# Patient Record
Sex: Male | Born: 1970 | Race: White | Hispanic: No | Marital: Single | Smoking: Never smoker
Health system: Southern US, Community
[De-identification: ages and names within clinical notes are randomized; demographics above are authoritative.]

## PROBLEM LIST (undated history)

## (undated) DIAGNOSIS — F32A Depression, unspecified: Secondary | ICD-10-CM

## (undated) DIAGNOSIS — G35 Multiple sclerosis: Secondary | ICD-10-CM

## (undated) DIAGNOSIS — F329 Major depressive disorder, single episode, unspecified: Secondary | ICD-10-CM

## (undated) DIAGNOSIS — G35D Multiple sclerosis, unspecified: Secondary | ICD-10-CM

## (undated) DIAGNOSIS — G709 Myoneural disorder, unspecified: Secondary | ICD-10-CM

## (undated) HISTORY — DX: Myoneural disorder, unspecified: G70.9

## (undated) HISTORY — DX: Major depressive disorder, single episode, unspecified: F32.9

## (undated) HISTORY — DX: Depression, unspecified: F32.A

## (undated) HISTORY — DX: Multiple sclerosis, unspecified: G35.D

## (undated) HISTORY — DX: Multiple sclerosis: G35

---

## 2002-05-26 ENCOUNTER — Emergency Department (HOSPITAL_COMMUNITY): Admission: EM | Admit: 2002-05-26 | Discharge: 2002-05-26 | Payer: Self-pay | Admitting: *Deleted

## 2007-02-17 ENCOUNTER — Emergency Department (HOSPITAL_COMMUNITY): Admission: EM | Admit: 2007-02-17 | Discharge: 2007-02-18 | Payer: Self-pay | Admitting: Emergency Medicine

## 2009-11-29 ENCOUNTER — Encounter: Admission: RE | Admit: 2009-11-29 | Discharge: 2009-11-29 | Payer: Self-pay | Admitting: Family Medicine

## 2010-03-23 ENCOUNTER — Encounter (HOSPITAL_COMMUNITY): Admission: RE | Admit: 2010-03-23 | Discharge: 2010-03-25 | Payer: Self-pay

## 2010-06-24 ENCOUNTER — Ambulatory Visit: Admission: RE | Admit: 2010-06-24 | Discharge: 2010-06-24 | Payer: Self-pay | Admitting: Family Medicine

## 2011-06-21 LAB — URINALYSIS, ROUTINE W REFLEX MICROSCOPIC
Bilirubin Urine: NEGATIVE
Ketones, ur: NEGATIVE
Nitrite: NEGATIVE
Specific Gravity, Urine: 1.016
Urobilinogen, UA: 0.2
pH: 6

## 2011-09-27 DIAGNOSIS — G35D Multiple sclerosis, unspecified: Secondary | ICD-10-CM | POA: Insufficient documentation

## 2012-03-08 ENCOUNTER — Ambulatory Visit (INDEPENDENT_AMBULATORY_CARE_PROVIDER_SITE_OTHER): Payer: 59 | Admitting: Emergency Medicine

## 2012-03-08 VITALS — BP 110/70 | HR 81 | Temp 98.3°F | Resp 16 | Ht 67.5 in | Wt 151.0 lb

## 2012-03-08 DIAGNOSIS — J329 Chronic sinusitis, unspecified: Secondary | ICD-10-CM

## 2012-03-08 DIAGNOSIS — R599 Enlarged lymph nodes, unspecified: Secondary | ICD-10-CM

## 2012-03-08 DIAGNOSIS — J029 Acute pharyngitis, unspecified: Secondary | ICD-10-CM

## 2012-03-08 DIAGNOSIS — R591 Generalized enlarged lymph nodes: Secondary | ICD-10-CM

## 2012-03-08 DIAGNOSIS — R05 Cough: Secondary | ICD-10-CM

## 2012-03-08 MED ORDER — AMOXICILLIN-POT CLAVULANATE 875-125 MG PO TABS: 1.0000 | ORAL_TABLET | Freq: Two times a day (BID) | ORAL | Status: DC

## 2012-03-08 MED ORDER — FLUTICASONE PROPIONATE 50 MCG/ACT NA SUSP: 2.0000 | Freq: Every day | NASAL | Status: DC

## 2012-03-08 MED ORDER — HYDROCODONE-HOMATROPINE 5-1.5 MG/5ML PO SYRP: 5.0000 mL | ORAL_SOLUTION | Freq: Three times a day (TID) | ORAL | Status: DC | PRN

## 2012-03-08 NOTE — Patient Instructions (Signed)

## 2012-03-08 NOTE — Progress Notes (Signed)
  Subjective:    Patient ID: Colin Brown, male    DOB: [birth year], 41 y.o.   MRN: [id number]  HPI patient with history of MS presents with a few week history of head congestion. He initially thought this was all allergy related but it has become increasingly severe recently. He denies a sore throat and swollen glands in his neck.    Review of Systems     Objective:   Physical Exam  Constitutional: He appears well-developed and well-nourished.  HENT:  Head: Normocephalic.       There is redness of the posterior pharynx with a 1 x 1 cm left anterior cervical node  Cardiovascular: Normal rate and regular rhythm.   Pulmonary/Chest: Effort normal and breath sounds normal. No respiratory distress.    Results for orders placed in visit on 03/08/12  POCT RAPID STREP A (OFFICE)      Component Value Range   Rapid Strep A Screen Negative  Negative        Assessment & Plan:  Patient symptoms probably started with allergies. He has signs of sinusitis with lymphadenitis on the left side of his neck. We'll treat with Flonase and Augmentin have been a strep test because he has 2 children at home.

## 2012-03-08 NOTE — Addendum Note (Signed)
Addended by: Lesle Chris A on: 03/08/2012 06:21 PM   Modules accepted: Orders

## 2012-03-14 ENCOUNTER — Ambulatory Visit (INDEPENDENT_AMBULATORY_CARE_PROVIDER_SITE_OTHER): Payer: 59 | Admitting: Family Medicine

## 2012-03-14 ENCOUNTER — Telehealth: Payer: Self-pay

## 2012-03-14 VITALS — BP 117/81 | HR 87 | Temp 97.9°F | Resp 16 | Ht 67.0 in | Wt 145.0 lb

## 2012-03-14 DIAGNOSIS — J019 Acute sinusitis, unspecified: Secondary | ICD-10-CM

## 2012-03-14 MED ORDER — LEVOFLOXACIN 500 MG PO TABS: 500.0000 mg | ORAL_TABLET | Freq: Every day | ORAL | Status: AC

## 2012-03-14 NOTE — Telephone Encounter (Signed)
Pt taken 7 days antibiotic,not improved,please advise   Best phone [phone]  [facility] w mkt.

## 2012-03-14 NOTE — Progress Notes (Signed)
@[email or web address] @   Patient ID: Colin Brown MRN: [id number], DOB: [birth year], 41 y.o. Date of Encounter: 03/14/2012, 6:01 PM  Primary Physician: Tally Due, MD  Chief Complaint:  Chief Complaint  Patient presents with  . Follow-up    no improvement since last OV  . Fatigue    feels feverish    HPI: 41 y.o. year old male presents with 21 day history of nasal congestion, post nasal drip, sore throat, sinus pressure, and cough. Afebrile. No chills. Nasal congestion thick and green/yellow. Sinus pressure is the worst symptom. Cough is productive secondary to post nasal drip and not associated with time of day. Ears feel full, leading to sensation of muffled hearing. Has tried OTC cold preps without success. No GI complaints. Appetite fair  No recent antibiotics, recent travels, or sick contacts   No leg trauma, sedentary periods, h/o cancer, or tobacco use.  No past medical history on file.   Home Meds: Prior to Admission medications   Medication Sig Start Date End Date Taking? Authorizing Provider  amoxicillin-clavulanate (AUGMENTIN) 875-125 MG per tablet Take 1 tablet by mouth 2 (two) times daily. 03/08/12 03/18/12 Yes Collene Gobble, MD  baclofen (LIORESAL) 20 MG tablet Take 20 mg by mouth 3 (three) times daily.   Yes Historical Provider, MD  escitalopram (LEXAPRO) 20 MG tablet Take 20 mg by mouth daily.   Yes Historical Provider, MD  fluticasone (FLONASE) 50 MCG/ACT nasal spray Place 2 sprays into the nose daily. 03/08/12 03/08/13 Yes Collene Gobble, MD  gabapentin (NEURONTIN) 300 MG capsule Take 300 mg by mouth 3 (three) times daily. 2- 300mg  in the am; 2-3 at night   Yes Historical Provider, MD  glatiramer (COPAXONE) 20 MG/ML injection Inject 20 mg into the skin daily.   Yes Historical Provider, MD  HYDROcodone-homatropine (HYCODAN) 5-1.5 MG/5ML syrup Take 5 mLs by mouth every 8 (eight) hours as needed for cough. 03/08/12 03/18/12 Yes Collene Gobble, MD    Allergies: No Known  Allergies  History   Social History  . Marital Status: Single    Spouse Name: N/A    Number of Children: N/A  . Years of Education: N/A   Occupational History  . Not on file.   Social History Main Topics  . Smoking status: Never Smoker   . Smokeless tobacco: Not on file  . Alcohol Use: Not on file  . Drug Use: Not on file  . Sexually Active: Not on file   Other Topics Concern  . Not on file   Social History Narrative  . No narrative on file     Review of Systems: Constitutional: negative for , fever, night sweats or weight changes Cardiovascular: negative for chest pain or palpitations Respiratory: negative for hemoptysis, wheezing, or shortness of breath Abdominal: negative for abdominal pain, nausea, vomiting or diarrhea Dermatological: negative for rash Neurologic: negative for headache   Physical Exam: Blood pressure 117/81, pulse 87, temperature 97.9 F (36.6 C), temperature source Oral, resp. rate 16, height 5\' 7"  (1.702 m), weight 145 lb (65.772 kg), SpO2 96.00%., Body mass index is 22.71 kg/(m^2). General: Well developed, well nourished, in no acute distress. Head: Normocephalic, atraumatic, eyes without discharge, sclera non-icteric, nares are congested. Bilateral auditory canals clear, TM's are without perforation, pearly grey with reflective cone of light bilaterally. Serous effusion bilaterally behind TM's. Maxillary sinus TTP. Oral cavity moist, dentition normal. Posterior pharynx with post nasal drip and mild erythema. No peritonsillar abscess or tonsillar exudate. Neck: Supple. No thyromegaly.  Full ROM. No lymphadenopathy. Lungs: Clear bilaterally to auscultation without wheezes, rales, or rhonchi. Breathing is unlabored.  Heart: RRR with S1 S2. No murmurs, rubs, or gallops appreciated. Msk:  Strength and tone normal for age. Extremities: No clubbing or cyanosis. No edema. Neuro: Alert and oriented X 3. Moves all extremities spontaneously. CNII-XII grossly  in tact. Psych:  Responds to questions appropriately with a normal affect.     ASSESSMENT AND PLAN:  41 y.o. year old male with sinusitis 1. Sinusitis acute  levofloxacin (LEVAQUIN) 500 MG tablet    - -Mucinex -Tylenol/Motrin prn -Rest/fluids -RTC precautions -RTC 3-5 days if no improvement  Signed, Elvina Sidle, MD 03/14/2012 6:01 PM

## 2012-03-14 NOTE — Telephone Encounter (Signed)
Spoke with patient and he stated that he still has swollen neck.  Advised patient to return to clinic for reevaluation.  He will come in today or tomorrow.

## 2012-03-27 DIAGNOSIS — F419 Anxiety disorder, unspecified: Secondary | ICD-10-CM | POA: Insufficient documentation

## 2012-04-30 DIAGNOSIS — Z8669 Personal history of other diseases of the nervous system and sense organs: Secondary | ICD-10-CM | POA: Insufficient documentation

## 2012-09-10 ENCOUNTER — Ambulatory Visit (INDEPENDENT_AMBULATORY_CARE_PROVIDER_SITE_OTHER): Payer: 59 | Admitting: Emergency Medicine

## 2012-09-10 VITALS — BP 88/64 | HR 99 | Temp 100.4°F | Resp 18 | Wt 148.0 lb

## 2012-09-10 DIAGNOSIS — J029 Acute pharyngitis, unspecified: Secondary | ICD-10-CM

## 2012-09-10 DIAGNOSIS — R509 Fever, unspecified: Secondary | ICD-10-CM

## 2012-09-10 DIAGNOSIS — J111 Influenza due to unidentified influenza virus with other respiratory manifestations: Secondary | ICD-10-CM

## 2012-09-10 LAB — POCT INFLUENZA A/B: Influenza A, POC: NEGATIVE

## 2012-09-10 MED ORDER — HYDROCOD POLST-CHLORPHEN POLST 10-8 MG/5ML PO LQCR: 5.0000 mL | Freq: Two times a day (BID) | ORAL | Status: DC | PRN

## 2012-09-10 MED ORDER — OSELTAMIVIR PHOSPHATE 75 MG PO CAPS: 75.0000 mg | ORAL_CAPSULE | Freq: Two times a day (BID) | ORAL | Status: DC

## 2012-09-10 NOTE — Progress Notes (Signed)
[facility] [address], [place] [address] [phone]  Date:  09/10/2012   Name:  Colin Brown   DOB:  1971/05/29   MRN:  [id number]  PCP:  Tally Due, MD    Chief Complaint: Sore Throat, Headache, Generalized Body Aches, Fever and Nausea   History of Present Illness:  Colin Brown is a 42 y.o. very pleasant male patient who presents with the following:  Sudden onset Sunday of fever, chills, cough and nasal congestion and drainage.  No nausea or vomiting.  Some loose stool.  No wheezing or shortness of breath. Cough productive of purulent sputum.  Clear nasal drainage.  No flu shot. Ill contacts at work.  There is no problem list on file for this patient.   Past Medical History  Diagnosis Date  . Depression   . Neuromuscular disorder   . Multiple sclerosis     History reviewed. No pertinent past surgical history.  History  Substance Use Topics  . Smoking status: Never Smoker   . Smokeless tobacco: Not on file  . Alcohol Use: Yes     Comment: occasional    Family History  Problem Relation Age of Onset  . Hypertension Mother     No Known Allergies  Medication list has been reviewed and updated.  Current Outpatient Prescriptions on File Prior to Visit  Medication Sig Dispense Refill  . amphetamine-dextroamphetamine (ADDERALL) 20 MG tablet Take 20 mg by mouth daily.      . baclofen (LIORESAL) 20 MG tablet Take 10 mg by mouth 3 (three) times daily.       Marland Kitchen escitalopram (LEXAPRO) 20 MG tablet Take 20 mg by mouth daily.      . fluticasone (FLONASE) 50 MCG/ACT nasal spray Place 2 sprays into the nose daily.  16 g  6  . gabapentin (NEURONTIN) 300 MG capsule Take 300 mg by mouth 3 (three) times daily. 2- 300mg  in the am; 2-3 at night      . glatiramer (COPAXONE) 20 MG/ML injection Inject 20 mg into the skin daily.        Review of Systems:  As per HPI, otherwise negative.    Physical Examination: Filed Vitals:   09/10/12 1324    BP: 88/64  Pulse: 99  Temp: 100.4 F (38 C)  Resp: 18   Filed Vitals:   09/10/12 1324  Weight: 148 lb (67.132 kg)   There is no height on file to calculate BMI. Ideal Body Weight:    GEN: WDWN, NAD, Non-toxic, A & O x 3 HEENT: Atraumatic, Normocephalic. Neck supple. No masses, No LAD. Ears and Nose: No external deformity. CV: RRR, No M/G/R. No JVD. No thrill. No extra heart sounds. PULM: CTA B, no wheezes, crackles, rhonchi. No retractions. No resp. distress. No accessory muscle use. ABD: S, NT, ND, +BS. No rebound. No HSM. EXTR: No c/c/e NEURO Normal gait.  PSYCH: Normally interactive. Conversant. Not depressed or anxious appearing.  Calm demeanor.    Assessment and Plan: Cough Flu test Influenza tamiflu IAW CDC guideline with negative POCT tussionex  Carmelina Dane, MD  Results for orders placed in visit on 09/10/12  POCT INFLUENZA A/B      Component Value Range   Influenza A, POC Negative     Influenza B, POC Negative

## 2012-09-10 NOTE — Progress Notes (Signed)
Reviewed and agree.

## 2012-09-10 NOTE — Patient Instructions (Signed)

## 2012-09-16 ENCOUNTER — Ambulatory Visit (INDEPENDENT_AMBULATORY_CARE_PROVIDER_SITE_OTHER): Payer: 59 | Admitting: Family Medicine

## 2012-09-16 VITALS — BP 132/80 | HR 113 | Temp 99.7°F | Resp 17 | Ht 67.0 in | Wt 139.0 lb

## 2012-09-16 DIAGNOSIS — R059 Cough, unspecified: Secondary | ICD-10-CM

## 2012-09-16 DIAGNOSIS — J020 Streptococcal pharyngitis: Secondary | ICD-10-CM

## 2012-09-16 DIAGNOSIS — J029 Acute pharyngitis, unspecified: Secondary | ICD-10-CM

## 2012-09-16 DIAGNOSIS — R05 Cough: Secondary | ICD-10-CM

## 2012-09-16 LAB — POCT CBC
Granulocyte percent: 73.1 %G (ref 37–80)
Lymph, poc: 2.4 (ref 0.6–3.4)
MCHC: 31.1 g/dL — AB (ref 31.8–35.4)
MPV: 7.1 fL (ref 0–99.8)
POC Granulocyte: 9 — AB (ref 2–6.9)
POC LYMPH PERCENT: 19.9 %L (ref 10–50)
POC MID %: 7 %M (ref 0–12)
RDW, POC: 13.7 %

## 2012-09-16 MED ORDER — HYDROCODONE-HOMATROPINE 5-1.5 MG/5ML PO SYRP: 5.0000 mL | ORAL_SOLUTION | ORAL | Status: AC | PRN

## 2012-09-16 MED ORDER — AMOXICILLIN 875 MG PO TABS: 875.0000 mg | ORAL_TABLET | Freq: Two times a day (BID) | ORAL | Status: DC

## 2012-09-16 NOTE — Progress Notes (Signed)
  Subjective: Patient was seen last week. He was feeling better by early in the weekend, then yesterday and today saw worse. He has sore throat and sinus pain and congestion and cough.  Objective: His TMs are normal. Throat erythematous without exudate. Strep screen was done. Neck supple without nodes or thyromegaly. Chest clear. He is a little tender over frontal maxillary sinuses.  Assessment: Streptococcal pharyngitis Sinusitis  Plan: Amoxicillin If his family gets sick suspect strep. [employer] work [employer] account executive.              Results for orders placed in visit on 09/16/12  POCT RAPID STREP A (OFFICE)      Component Value Range   Rapid Strep A Screen Positive (*) Negative  POCT CBC      Component Value Range   WBC 12.3 (*) 4.6 - 10.2 K/uL   Lymph, poc 2.4  0.6 - 3.4   POC LYMPH PERCENT 19.9  10 - 50 %L   MID (cbc) 0.9  0 - 0.9   POC MID % 7.0  0 - 12 %M   POC Granulocyte 9.0 (*) 2 - 6.9   Granulocyte percent 73.1  37 - 80 %G   RBC 4.52 (*) 4.69 - 6.13 M/uL   Hemoglobin 13.2 (*) 14.1 - 18.1 g/dL   HCT, POC 21.3 (*) 08.6 - 53.7 %   MCV 93.9  80 - 97 fL   MCH, POC 29.2  27 - 31.2 pg   MCHC 31.1 (*) 31.8 - 35.4 g/dL   RDW, POC 57.8     Platelet Count, POC 446 (*) 142 - 424 K/uL   MPV 7.1  0 - 99.8 fL

## 2012-09-16 NOTE — Patient Instructions (Signed)

## 2017-06-30 ENCOUNTER — Emergency Department (HOSPITAL_COMMUNITY): Payer: Medicare Other

## 2017-06-30 ENCOUNTER — Emergency Department (HOSPITAL_COMMUNITY)
Admission: EM | Admit: 2017-06-30 | Discharge: 2017-07-01 | Disposition: A | Discharge status: Home / Self Care | Payer: Medicare Other | Attending: Emergency Medicine | Admitting: Emergency Medicine

## 2017-06-30 ENCOUNTER — Encounter (HOSPITAL_COMMUNITY): Payer: Self-pay | Admitting: Emergency Medicine

## 2017-06-30 DIAGNOSIS — Z23 Encounter for immunization: Secondary | ICD-10-CM | POA: Insufficient documentation

## 2017-06-30 DIAGNOSIS — Y929 Unspecified place or not applicable: Secondary | ICD-10-CM | POA: Diagnosis not present

## 2017-06-30 DIAGNOSIS — S0993XA Unspecified injury of face, initial encounter: Secondary | ICD-10-CM

## 2017-06-30 DIAGNOSIS — W19XXXA Unspecified fall, initial encounter: Secondary | ICD-10-CM | POA: Diagnosis not present

## 2017-06-30 DIAGNOSIS — S01511A Laceration without foreign body of lip, initial encounter: Secondary | ICD-10-CM | POA: Diagnosis not present

## 2017-06-30 DIAGNOSIS — Y9389 Activity, other specified: Secondary | ICD-10-CM | POA: Diagnosis not present

## 2017-06-30 DIAGNOSIS — Z79899 Other long term (current) drug therapy: Secondary | ICD-10-CM | POA: Diagnosis not present

## 2017-06-30 DIAGNOSIS — Y998 Other external cause status: Secondary | ICD-10-CM | POA: Diagnosis not present

## 2017-06-30 DIAGNOSIS — R55 Syncope and collapse: Secondary | ICD-10-CM | POA: Diagnosis not present

## 2017-06-30 DIAGNOSIS — S022XXA Fracture of nasal bones, initial encounter for closed fracture: Secondary | ICD-10-CM | POA: Diagnosis not present

## 2017-06-30 LAB — CBC WITH DIFFERENTIAL/PLATELET
BASOS ABS: 0 10*3/uL (ref 0.0–0.1)
BASOS PCT: 0 %
Eosinophils Absolute: 0.1 10*3/uL (ref 0.0–0.7)
Eosinophils Relative: 1 %
HEMATOCRIT: 39.7 % (ref 39.0–52.0)
Hemoglobin: 14 g/dL (ref 13.0–17.0)
LYMPHS ABS: 2.2 10*3/uL (ref 0.7–4.0)
LYMPHS PCT: 21 %
MCH: 31.4 pg (ref 26.0–34.0)
MCHC: 35.3 g/dL (ref 30.0–36.0)
MCV: 89 fL (ref 78.0–100.0)
MONO ABS: 0.7 10*3/uL (ref 0.1–1.0)
Monocytes Relative: 6 %
NEUTROS PCT: 72 %
Neutro Abs: 7.5 10*3/uL (ref 1.7–7.7)
Platelets: 302 10*3/uL (ref 150–400)
RBC: 4.46 MIL/uL (ref 4.22–5.81)
RDW: 12.9 % (ref 11.5–15.5)
WBC: 10.4 10*3/uL (ref 4.0–10.5)

## 2017-06-30 LAB — BASIC METABOLIC PANEL
ANION GAP: 10 (ref 5–15)
BUN: 16 mg/dL (ref 6–20)
CALCIUM: 8.8 mg/dL — AB (ref 8.9–10.3)
CO2: 25 mmol/L (ref 22–32)
Chloride: 104 mmol/L (ref 101–111)
Creatinine, Ser: 1.21 mg/dL (ref 0.61–1.24)
Glucose, Bld: 126 mg/dL — ABNORMAL HIGH (ref 65–99)
Potassium: 3.5 mmol/L (ref 3.5–5.1)
SODIUM: 139 mmol/L (ref 135–145)

## 2017-06-30 LAB — I-STAT TROPONIN, ED: TROPONIN I, POC: 0 ng/mL (ref 0.00–0.08)

## 2017-06-30 LAB — CBG MONITORING, ED: GLUCOSE-CAPILLARY: 93 mg/dL (ref 65–99)

## 2017-06-30 MED ORDER — SODIUM CHLORIDE 0.9 % IV BOLUS (SEPSIS)
1000.0000 mL | Freq: Once | INTRAVENOUS | Status: AC
  Administered 2017-06-30: 1000 mL via INTRAVENOUS

## 2017-06-30 MED ORDER — LIDOCAINE HCL (PF) 1 % IJ SOLN
5.0000 mL | Freq: Once | INTRAMUSCULAR | Status: AC
  Administered 2017-07-01: 5 mL
  Filled 2017-06-30: qty 5

## 2017-06-30 MED ORDER — HYDROCODONE-ACETAMINOPHEN 5-325 MG PO TABS
2.0000 | ORAL_TABLET | Freq: Once | ORAL | Status: AC
  Administered 2017-06-30: 2 via ORAL
  Filled 2017-06-30: qty 2

## 2017-06-30 MED ORDER — TETANUS-DIPHTH-ACELL PERTUSSIS 5-2.5-18.5 LF-MCG/0.5 IM SUSP
0.5000 mL | Freq: Once | INTRAMUSCULAR | Status: AC
  Administered 2017-06-30: 0.5 mL via INTRAMUSCULAR
  Filled 2017-06-30: qty 0.5
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
## 2017-06-30 NOTE — ED Notes (Signed)
Bed: WA24 Expected date:  Expected time:  Means of arrival:  Comments: syncope 

## 2017-06-30 NOTE — Discharge Instructions (Signed)
Your fracture information is listed below.  Please discuss this with your Dentist tomorrow.  Acute avulsion fracture tooth 8 through alveolar ridge. Nondisplaced fracture through nasal spine.  Sutures should dissolve in 5-7 days.  Use salt water rinses.  Take antibiotics as directed.
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
## 2017-07-01 MED ORDER — HYDROCODONE-ACETAMINOPHEN 5-325 MG PO TABS: 1.0000 | ORAL_TABLET | Freq: Four times a day (QID) | ORAL | 0 refills | Status: DC | PRN

## 2017-07-01 MED ORDER — CLINDAMYCIN HCL 150 MG PO CAPS: 300.0000 mg | ORAL_CAPSULE | Freq: Three times a day (TID) | ORAL | 0 refills | Status: DC

## 2017-09-20 DIAGNOSIS — F339 Major depressive disorder, recurrent, unspecified: Secondary | ICD-10-CM | POA: Insufficient documentation

## 2019-06-01 IMAGING — CT CT MAXILLOFACIAL W/O CM
3 of 7 series · 16 of 47 positions shown, 19 images · non-contrast
Comparison: None.

CLINICAL DATA: Syncopal episode, fell on face after drinking beers.
Facial pain. History of multiple sclerosis.

EXAM:
CT HEAD WITHOUT CONTRAST
CT MAXILLOFACIAL WITHOUT CONTRAST
TECHNIQUE: Multidetector CT imaging of the head and maxillofacial structures
were performed using the standard protocol without intravenous
contrast. Multiplanar CT image reconstructions of the maxillofacial
structures were also generated.

[Series 5: coronal · coronal · 0.29mm/px · 3 of 74 slices shown]
[im 17/74  bone]
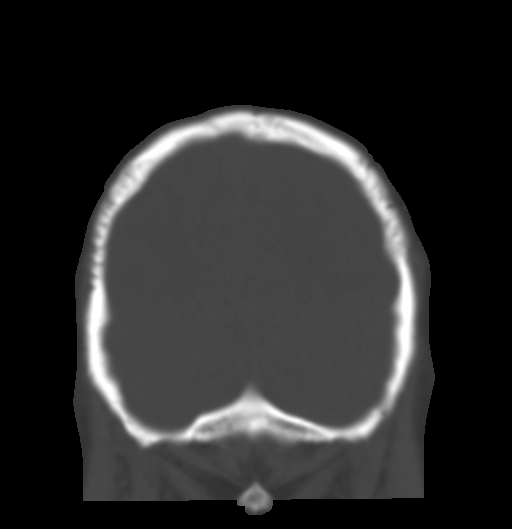
[im 25/74  bone]
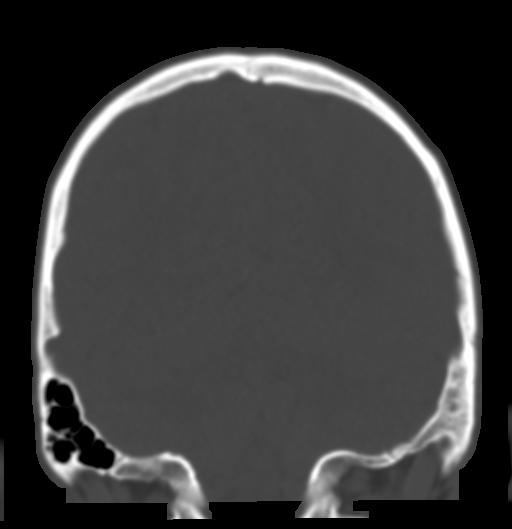
[im 33/74  bone]
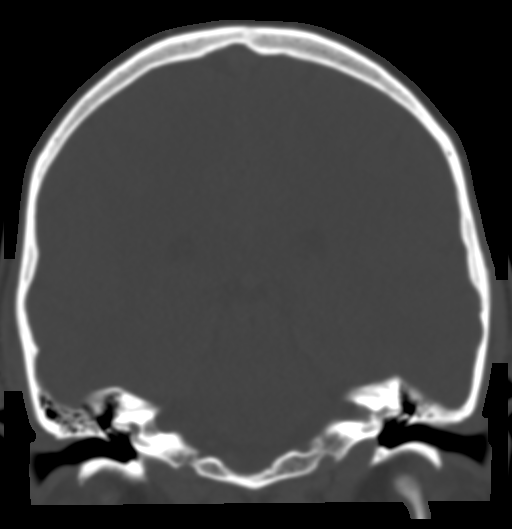

[Series 7: facial st · axial · 0.32mm/px · z∈[-221,-83]mm · 11 of 81 slices shown, 14 images]
[im 6/81  brain]
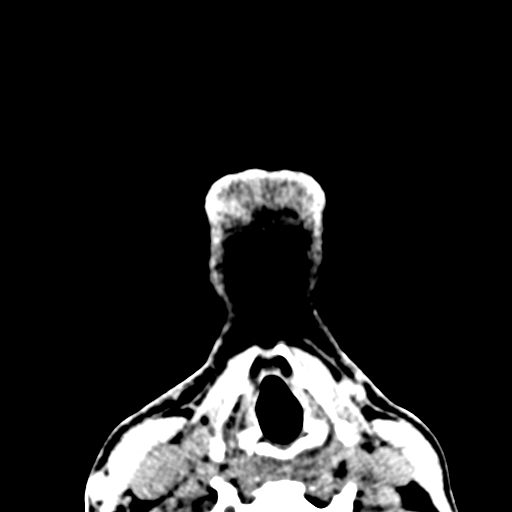
[im 6/81  bone]
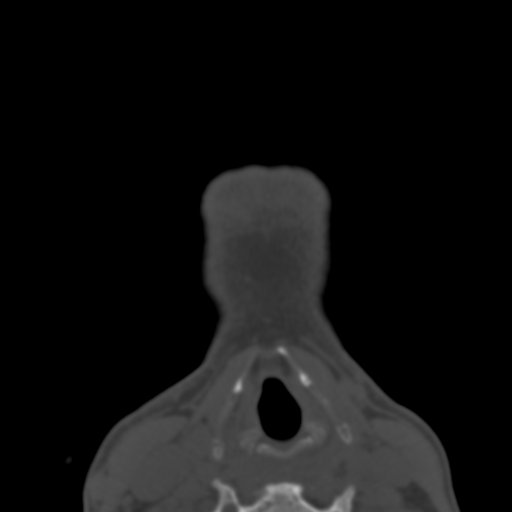
[im 12/81  bone]
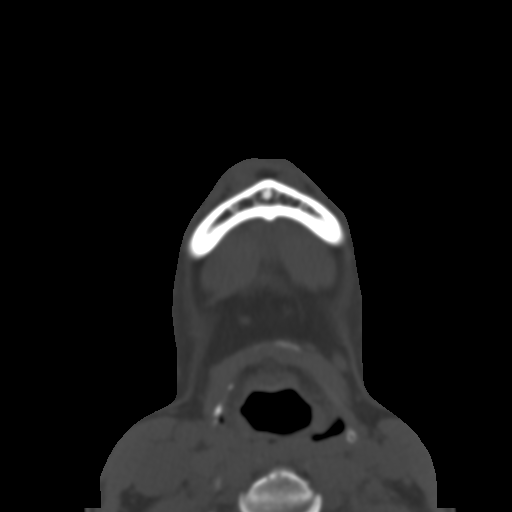
[im 18/81  bone]
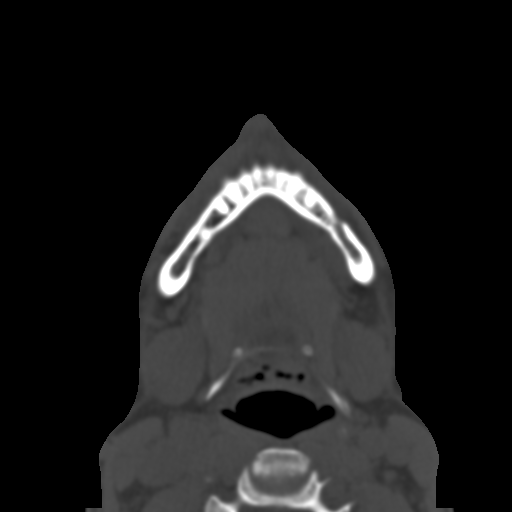
[im 29/81  bone]
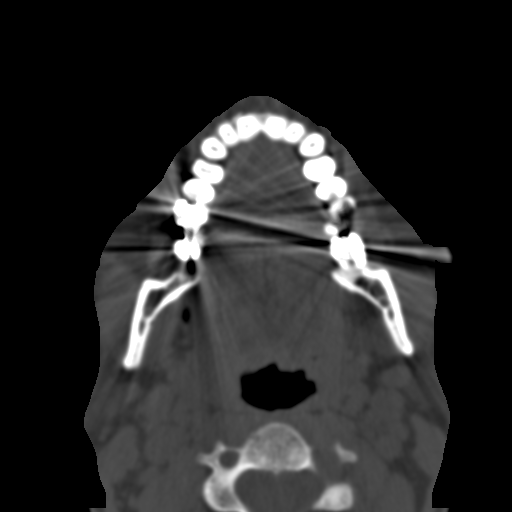
[im 35/81  brain]
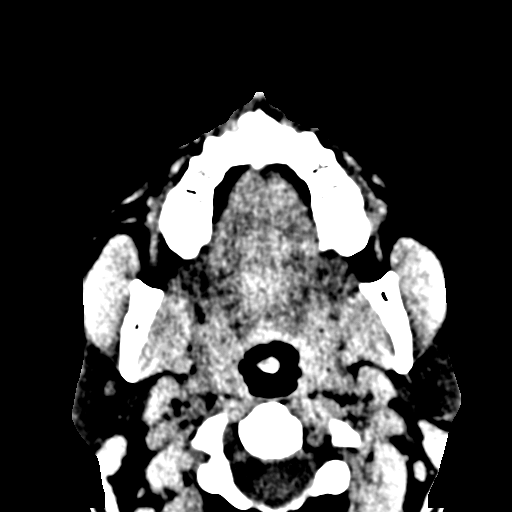
[im 35/81  bone]
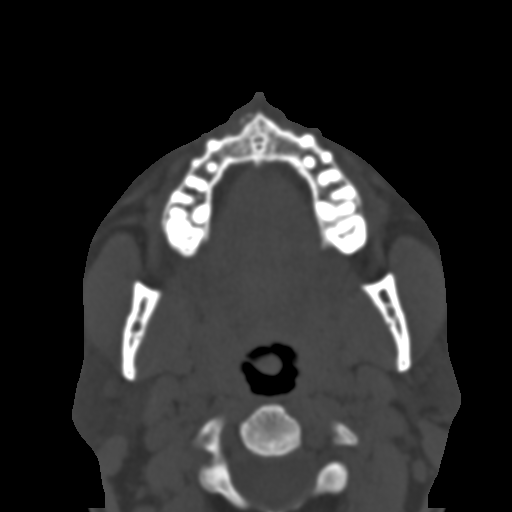
[im 41/81  bone]
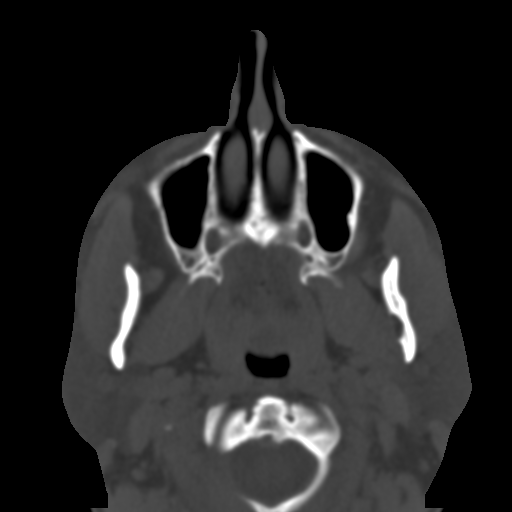
[im 46/81  bone]
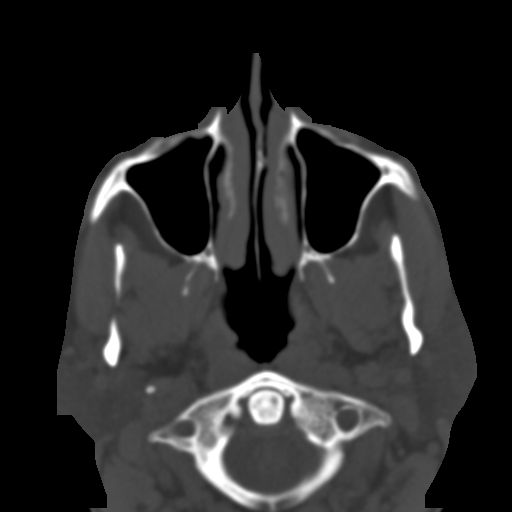
[im 52/81  bone]
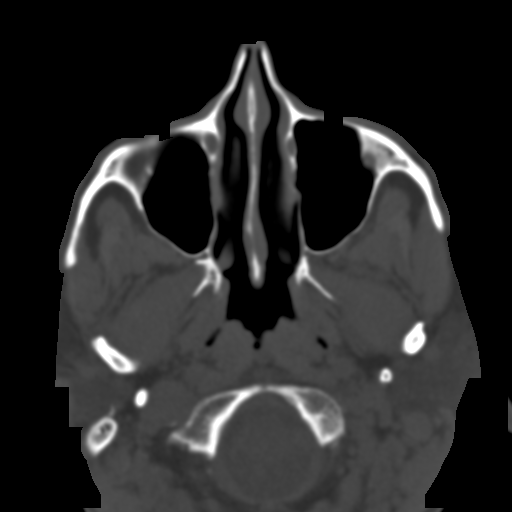
[im 63/81  brain]
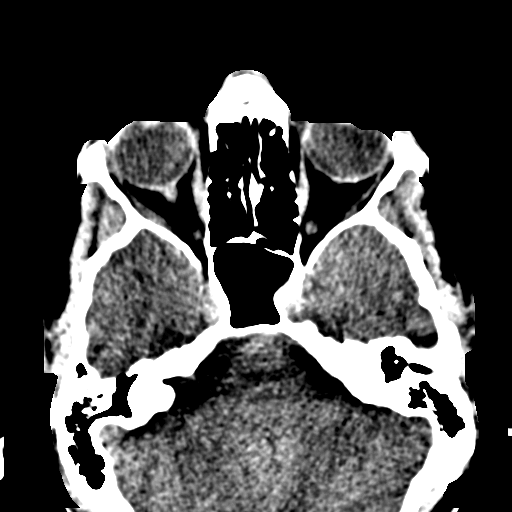
[im 63/81  bone]
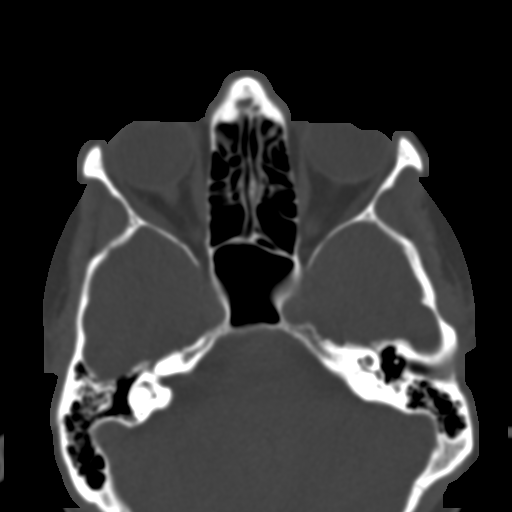
[im 69/81  bone]
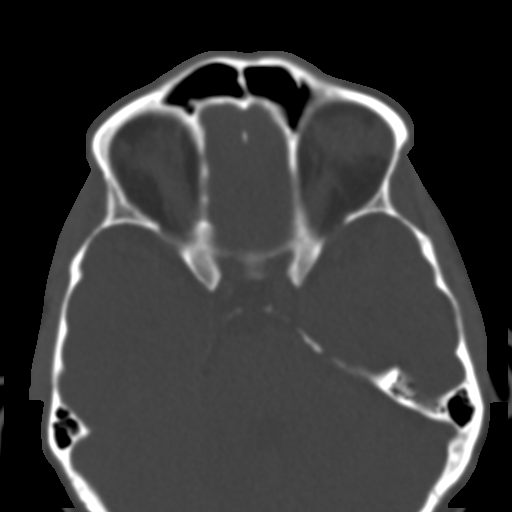
[im 75/81  bone]
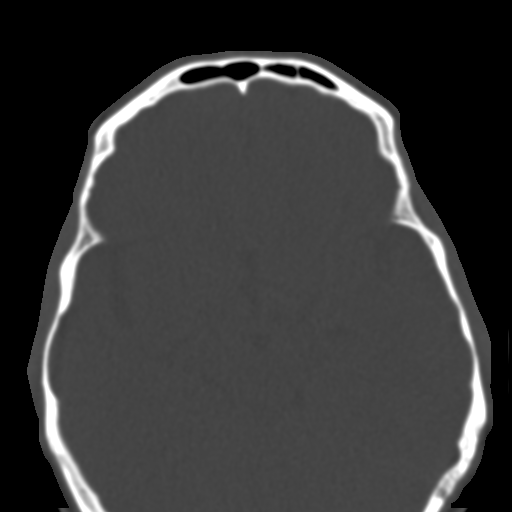

[Series 10: sagittal st · sagittal · 0.32mm/px · 2 of 73 slices shown]
[im 25/73  bone]
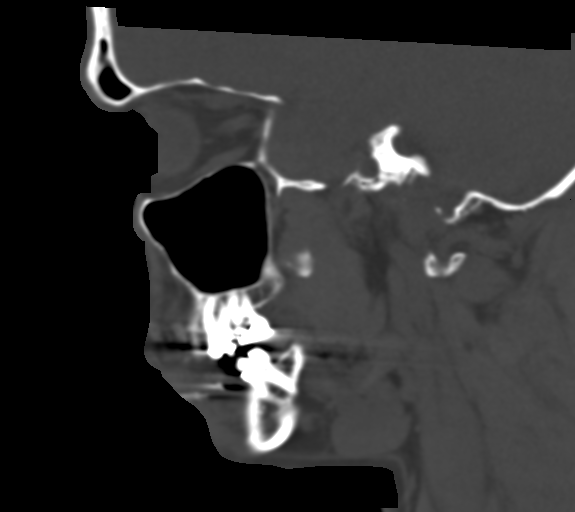
[im 49/73  bone]
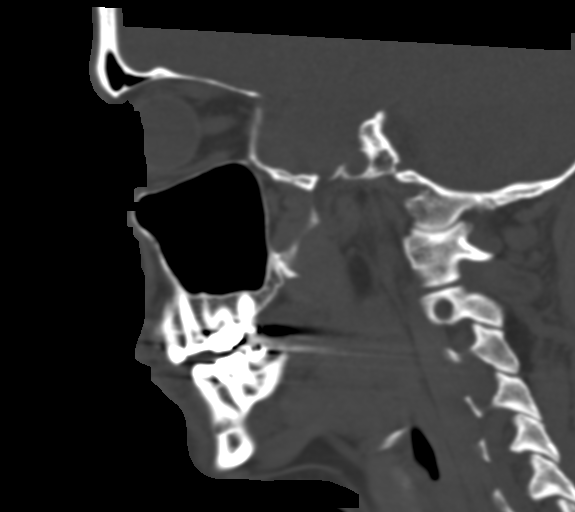

[16 of 47 positions shown; findings below may reference images not displayed]

FINDINGS: CT HEAD FINDINGS

BRAIN: No intraparenchymal hemorrhage, mass effect nor midline
shift. The ventricles and sulci are normal. No acute large vascular
territory infarcts. No abnormal extra-axial fluid collections. Basal
cisterns are patent.

VASCULAR: Unremarkable.

SKULL/SOFT TISSUES: No skull fracture. No significant soft tissue
swelling.

OTHER: None.

CT MAXILLOFACIAL FINDINGS

OSSEOUS: Acute avulsion fracture tooth 8 through alveolar ridge.
Nondisplaced fracture through nasal spine. Mandible is intact,
condyles are located. No destructive bony lesions.

ORBITS: Ocular globes and orbital contents are normal.

SINUSES: Paranasal sinuses are well aerated. Nasal septum is
midline, tiny spur directed to the LEFT. Included mastoid aircells
are well aerated.

SOFT TISSUES: Perioral soft tissue swelling without subcutaneous gas
or radiopaque foreign bodies. No subcutaneous gas or radiopaque
foreign bodies.
IMPRESSION: CT HEAD: Negative noncontrast CT HEAD.

CT MAXILLOFACIAL: Acute avulsion fracture tooth 8. Nondisplaced
nasal spine fracture.

## 2019-10-21 ENCOUNTER — Ambulatory Visit (INDEPENDENT_AMBULATORY_CARE_PROVIDER_SITE_OTHER): Payer: Medicare Other | Admitting: Psychology

## 2019-10-21 DIAGNOSIS — F321 Major depressive disorder, single episode, moderate: Secondary | ICD-10-CM

## 2019-11-04 ENCOUNTER — Ambulatory Visit (INDEPENDENT_AMBULATORY_CARE_PROVIDER_SITE_OTHER): Payer: Medicare Other | Admitting: Psychology

## 2019-11-04 DIAGNOSIS — F321 Major depressive disorder, single episode, moderate: Secondary | ICD-10-CM

## 2019-11-12 ENCOUNTER — Ambulatory Visit (INDEPENDENT_AMBULATORY_CARE_PROVIDER_SITE_OTHER): Payer: Medicare Other | Admitting: Psychology

## 2019-11-12 DIAGNOSIS — F321 Major depressive disorder, single episode, moderate: Secondary | ICD-10-CM

## 2019-11-18 ENCOUNTER — Ambulatory Visit: Payer: Medicare Other | Admitting: Psychology

## 2019-11-21 ENCOUNTER — Ambulatory Visit (INDEPENDENT_AMBULATORY_CARE_PROVIDER_SITE_OTHER): Payer: Medicare Other | Admitting: Psychology

## 2019-11-21 DIAGNOSIS — F321 Major depressive disorder, single episode, moderate: Secondary | ICD-10-CM | POA: Diagnosis not present

## 2019-12-04 ENCOUNTER — Ambulatory Visit (INDEPENDENT_AMBULATORY_CARE_PROVIDER_SITE_OTHER): Payer: Medicare Other | Admitting: Psychology

## 2019-12-04 DIAGNOSIS — F411 Generalized anxiety disorder: Secondary | ICD-10-CM

## 2019-12-04 DIAGNOSIS — F339 Major depressive disorder, recurrent, unspecified: Secondary | ICD-10-CM

## 2019-12-18 ENCOUNTER — Ambulatory Visit (INDEPENDENT_AMBULATORY_CARE_PROVIDER_SITE_OTHER): Payer: Medicare Other | Admitting: Psychology

## 2019-12-18 DIAGNOSIS — F321 Major depressive disorder, single episode, moderate: Secondary | ICD-10-CM

## 2019-12-30 DIAGNOSIS — J302 Other seasonal allergic rhinitis: Secondary | ICD-10-CM | POA: Insufficient documentation

## 2020-01-01 ENCOUNTER — Ambulatory Visit (INDEPENDENT_AMBULATORY_CARE_PROVIDER_SITE_OTHER): Payer: Medicare Other | Admitting: Psychology

## 2020-01-01 DIAGNOSIS — F321 Major depressive disorder, single episode, moderate: Secondary | ICD-10-CM | POA: Diagnosis not present

## 2020-01-15 ENCOUNTER — Ambulatory Visit (INDEPENDENT_AMBULATORY_CARE_PROVIDER_SITE_OTHER): Payer: Medicare Other | Admitting: Psychology

## 2020-01-15 DIAGNOSIS — F321 Major depressive disorder, single episode, moderate: Secondary | ICD-10-CM | POA: Diagnosis not present

## 2020-01-29 ENCOUNTER — Ambulatory Visit (INDEPENDENT_AMBULATORY_CARE_PROVIDER_SITE_OTHER): Payer: Medicare Other | Admitting: Psychology

## 2020-01-29 DIAGNOSIS — F321 Major depressive disorder, single episode, moderate: Secondary | ICD-10-CM | POA: Diagnosis not present

## 2020-02-12 ENCOUNTER — Ambulatory Visit: Payer: Medicare Other | Admitting: Psychology

## 2020-02-26 ENCOUNTER — Ambulatory Visit (INDEPENDENT_AMBULATORY_CARE_PROVIDER_SITE_OTHER): Payer: Medicare Other | Admitting: Psychology

## 2020-02-26 DIAGNOSIS — F321 Major depressive disorder, single episode, moderate: Secondary | ICD-10-CM | POA: Diagnosis not present

## 2020-03-11 ENCOUNTER — Ambulatory Visit (INDEPENDENT_AMBULATORY_CARE_PROVIDER_SITE_OTHER): Payer: Medicare Other | Admitting: Psychology

## 2020-03-11 DIAGNOSIS — F321 Major depressive disorder, single episode, moderate: Secondary | ICD-10-CM

## 2020-03-25 ENCOUNTER — Ambulatory Visit (INDEPENDENT_AMBULATORY_CARE_PROVIDER_SITE_OTHER): Payer: Medicare Other | Admitting: Psychology

## 2020-03-25 DIAGNOSIS — F321 Major depressive disorder, single episode, moderate: Secondary | ICD-10-CM | POA: Diagnosis not present

## 2020-04-08 ENCOUNTER — Ambulatory Visit: Payer: Medicare Other | Admitting: Psychology

## 2020-04-22 ENCOUNTER — Ambulatory Visit: Payer: Medicare Other | Admitting: Psychology

## 2020-05-06 ENCOUNTER — Ambulatory Visit: Payer: Medicare Other | Admitting: Psychology

## 2020-05-20 ENCOUNTER — Ambulatory Visit (INDEPENDENT_AMBULATORY_CARE_PROVIDER_SITE_OTHER): Payer: Medicare Other | Admitting: Psychology

## 2020-05-20 DIAGNOSIS — F321 Major depressive disorder, single episode, moderate: Secondary | ICD-10-CM | POA: Diagnosis not present

## 2020-06-03 ENCOUNTER — Ambulatory Visit: Payer: Medicare Other | Admitting: Psychology

## 2020-06-17 ENCOUNTER — Ambulatory Visit (INDEPENDENT_AMBULATORY_CARE_PROVIDER_SITE_OTHER): Payer: Medicare Other | Admitting: Psychology

## 2020-06-17 DIAGNOSIS — F321 Major depressive disorder, single episode, moderate: Secondary | ICD-10-CM | POA: Diagnosis not present

## 2020-07-15 ENCOUNTER — Ambulatory Visit: Payer: Medicare Other | Admitting: Psychology

## 2020-07-28 ENCOUNTER — Ambulatory Visit (INDEPENDENT_AMBULATORY_CARE_PROVIDER_SITE_OTHER): Payer: Medicare Other | Admitting: Psychology

## 2020-07-28 DIAGNOSIS — F321 Major depressive disorder, single episode, moderate: Secondary | ICD-10-CM

## 2020-08-25 ENCOUNTER — Ambulatory Visit (INDEPENDENT_AMBULATORY_CARE_PROVIDER_SITE_OTHER): Payer: Medicare Other | Admitting: Psychology

## 2020-08-25 DIAGNOSIS — F321 Major depressive disorder, single episode, moderate: Secondary | ICD-10-CM | POA: Diagnosis not present

## 2020-09-09 ENCOUNTER — Ambulatory Visit: Payer: Medicare Other | Admitting: Psychology

## 2020-09-23 ENCOUNTER — Ambulatory Visit: Payer: Medicare Other | Admitting: Psychology

## 2020-10-21 ENCOUNTER — Ambulatory Visit: Payer: Medicare Other | Admitting: Psychology

## 2021-07-05 DIAGNOSIS — I10 Essential (primary) hypertension: Secondary | ICD-10-CM | POA: Insufficient documentation

## 2021-07-05 DIAGNOSIS — R5383 Other fatigue: Secondary | ICD-10-CM | POA: Insufficient documentation

## 2021-07-05 DIAGNOSIS — E785 Hyperlipidemia, unspecified: Secondary | ICD-10-CM | POA: Insufficient documentation

## 2022-12-25 DIAGNOSIS — E559 Vitamin D deficiency, unspecified: Secondary | ICD-10-CM | POA: Diagnosis present

## 2024-09-06 ENCOUNTER — Inpatient Hospital Stay (HOSPITAL_COMMUNITY)
Admission: EM | Admit: 2024-09-06 | Discharge: 2024-09-08 | DRG: 059 | Disposition: A | Discharge status: Home / Self Care | Attending: Internal Medicine | Admitting: Internal Medicine

## 2024-09-06 ENCOUNTER — Other Ambulatory Visit: Payer: Self-pay

## 2024-09-06 ENCOUNTER — Encounter (HOSPITAL_COMMUNITY): Payer: Self-pay

## 2024-09-06 DIAGNOSIS — H469 Unspecified optic neuritis: Secondary | ICD-10-CM | POA: Diagnosis present

## 2024-09-06 DIAGNOSIS — F32A Depression, unspecified: Secondary | ICD-10-CM | POA: Diagnosis present

## 2024-09-06 DIAGNOSIS — R519 Headache, unspecified: Secondary | ICD-10-CM | POA: Diagnosis present

## 2024-09-06 DIAGNOSIS — Z885 Allergy status to narcotic agent status: Secondary | ICD-10-CM

## 2024-09-06 DIAGNOSIS — G35D Multiple sclerosis, unspecified: Principal | ICD-10-CM | POA: Diagnosis present

## 2024-09-06 DIAGNOSIS — E559 Vitamin D deficiency, unspecified: Secondary | ICD-10-CM | POA: Diagnosis present

## 2024-09-06 DIAGNOSIS — Z79899 Other long term (current) drug therapy: Secondary | ICD-10-CM

## 2024-09-06 DIAGNOSIS — I10 Essential (primary) hypertension: Secondary | ICD-10-CM | POA: Diagnosis present

## 2024-09-06 DIAGNOSIS — E785 Hyperlipidemia, unspecified: Secondary | ICD-10-CM | POA: Diagnosis present

## 2024-09-06 DIAGNOSIS — H4921 Sixth [abducent] nerve palsy, right eye: Secondary | ICD-10-CM | POA: Diagnosis present

## 2024-09-06 DIAGNOSIS — Z8249 Family history of ischemic heart disease and other diseases of the circulatory system: Secondary | ICD-10-CM

## 2024-09-06 DIAGNOSIS — H5461 Unqualified visual loss, right eye, normal vision left eye: Secondary | ICD-10-CM | POA: Diagnosis present

## 2024-09-06 DIAGNOSIS — G629 Polyneuropathy, unspecified: Secondary | ICD-10-CM | POA: Diagnosis present

## 2024-09-06 LAB — BASIC METABOLIC PANEL WITH GFR
Anion gap: 10 (ref 5–15)
BUN: 17 mg/dL (ref 6–20)
CO2: 27 mmol/L (ref 22–32)
Calcium: 9.6 mg/dL (ref 8.9–10.3)
Chloride: 102 mmol/L (ref 98–111)
Creatinine, Ser: 1.2 mg/dL (ref 0.61–1.24)
GFR, Estimated: >60 mL/min
Glucose, Bld: 84 mg/dL (ref 70–99)
Potassium: 3.8 mmol/L (ref 3.5–5.1)
Sodium: 138 mmol/L (ref 135–145)

## 2024-09-06 LAB — CBC WITH DIFFERENTIAL/PLATELET
Abs Immature Granulocytes: 0.04 K/uL (ref 0.00–0.07)
Basophils Absolute: 0 K/uL (ref 0.0–0.1)
Basophils Relative: 1 %
Eosinophils Absolute: 0.4 K/uL (ref 0.0–0.5)
Eosinophils Relative: 4 %
HCT: 45.8 % (ref 39.0–52.0)
Hemoglobin: 15.7 g/dL (ref 13.0–17.0)
Immature Granulocytes: 1 %
Lymphocytes Relative: 31 %
Lymphs Abs: 2.7 K/uL (ref 0.7–4.0)
MCH: 30.7 pg (ref 26.0–34.0)
MCHC: 34.3 g/dL (ref 30.0–36.0)
MCV: 89.5 fL (ref 80.0–100.0)
Monocytes Absolute: 0.6 K/uL (ref 0.1–1.0)
Monocytes Relative: 7 %
Neutro Abs: 5 K/uL (ref 1.7–7.7)
Neutrophils Relative %: 56 %
Platelets: 326 K/uL (ref 150–400)
RBC: 5.12 MIL/uL (ref 4.22–5.81)
RDW: 12.5 % (ref 11.5–15.5)
WBC: 8.8 K/uL (ref 4.0–10.5)
nRBC: 0 % (ref 0.0–0.2)

## 2024-09-06 MED ORDER — LORAZEPAM 2 MG/ML IJ SOLN
1.0000 mg | Freq: Once | INTRAMUSCULAR | Status: AC
  Administered 2024-09-07: 1 mg via INTRAVENOUS
  Filled 2024-09-06: qty 1

## 2024-09-06 NOTE — ED Triage Notes (Signed)
 Pt reports hx MS, states he thinks he is having optic neuritis in R eye, endorses diplopia x 1 week; hx same; pt also reports HA

## 2024-09-06 NOTE — ED Provider Triage Note (Signed)
 Emergency Medicine Provider Triage Evaluation Note  Colin Brown , a 54 y.o. male  was evaluated in triage.  Pt complains of double vision in the right eye that started about 1 week ago.  Reports pain behind the right eye.  Reports this feels somewhat similar to previous episode of optic neuritis.  Reports history of MS.  Also reports difficulty with movement in the right eye.  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  Pupils are equal round reactive bilaterally  Medical Decision Making  Medically screening exam initiated at 7:42 PM.  Appropriate orders placed.  Colin Brown was informed that the remainder of the evaluation will be completed by another provider, this initial triage assessment does not replace that evaluation, and the importance of remaining in the ED until their evaluation is complete.     Colin Palma, PA-C 09/06/24 1942

## 2024-09-07 ENCOUNTER — Emergency Department (HOSPITAL_COMMUNITY)

## 2024-09-07 DIAGNOSIS — Z8249 Family history of ischemic heart disease and other diseases of the circulatory system: Secondary | ICD-10-CM | POA: Diagnosis not present

## 2024-09-07 DIAGNOSIS — E559 Vitamin D deficiency, unspecified: Secondary | ICD-10-CM | POA: Diagnosis present

## 2024-09-07 DIAGNOSIS — F32A Depression, unspecified: Secondary | ICD-10-CM | POA: Diagnosis present

## 2024-09-07 DIAGNOSIS — G35D Multiple sclerosis, unspecified: Secondary | ICD-10-CM | POA: Diagnosis present

## 2024-09-07 DIAGNOSIS — H469 Unspecified optic neuritis: Secondary | ICD-10-CM | POA: Diagnosis present

## 2024-09-07 DIAGNOSIS — E785 Hyperlipidemia, unspecified: Secondary | ICD-10-CM | POA: Diagnosis present

## 2024-09-07 DIAGNOSIS — H532 Diplopia: Secondary | ICD-10-CM

## 2024-09-07 DIAGNOSIS — Z885 Allergy status to narcotic agent status: Secondary | ICD-10-CM | POA: Diagnosis not present

## 2024-09-07 DIAGNOSIS — H5461 Unqualified visual loss, right eye, normal vision left eye: Secondary | ICD-10-CM | POA: Diagnosis present

## 2024-09-07 DIAGNOSIS — G629 Polyneuropathy, unspecified: Secondary | ICD-10-CM | POA: Diagnosis present

## 2024-09-07 DIAGNOSIS — I10 Essential (primary) hypertension: Secondary | ICD-10-CM | POA: Diagnosis present

## 2024-09-07 DIAGNOSIS — Z79899 Other long term (current) drug therapy: Secondary | ICD-10-CM | POA: Diagnosis not present

## 2024-09-07 DIAGNOSIS — R519 Headache, unspecified: Secondary | ICD-10-CM | POA: Diagnosis present

## 2024-09-07 DIAGNOSIS — H4921 Sixth [abducent] nerve palsy, right eye: Secondary | ICD-10-CM | POA: Diagnosis present

## 2024-09-07 LAB — VITAMIN D 25 HYDROXY (VIT D DEFICIENCY, FRACTURES): Vit D, 25-Hydroxy: 38.6 ng/mL (ref 30–100)

## 2024-09-07 LAB — HIV ANTIBODY (ROUTINE TESTING W REFLEX): HIV Screen 4th Generation wRfx: NONREACTIVE

## 2024-09-07 MED ORDER — ACETAMINOPHEN 325 MG PO TABS
650.0000 mg | ORAL_TABLET | Freq: Four times a day (QID) | ORAL | Status: DC | PRN
  Administered 2024-09-07: 650 mg via ORAL
  Filled 2024-09-07: qty 2

## 2024-09-07 MED ORDER — VITAMIN D 25 MCG (1000 UNIT) PO TABS
1000.0000 [IU] | ORAL_TABLET | Freq: Every day | ORAL | Status: DC
  Administered 2024-09-08: 1000 [IU] via ORAL
  Filled 2024-09-07 (×2): qty 1

## 2024-09-07 MED ORDER — GADOBUTROL 1 MMOL/ML IV SOLN
6.0000 mL | Freq: Once | INTRAVENOUS | Status: AC | PRN
  Administered 2024-09-07: 6 mL via INTRAVENOUS

## 2024-09-07 MED ORDER — BACLOFEN 10 MG PO TABS
20.0000 mg | ORAL_TABLET | Freq: Every day | ORAL | Status: DC | PRN
  Administered 2024-09-07: 20 mg via ORAL
  Filled 2024-09-07: qty 2

## 2024-09-07 MED ORDER — SODIUM CHLORIDE 0.9 % IV SOLN
1000.0000 mg | INTRAVENOUS | Status: DC
  Administered 2024-09-07 – 2024-09-08 (×2): 1000 mg via INTRAVENOUS
  Filled 2024-09-07 (×3): qty 16

## 2024-09-07 MED ORDER — ENOXAPARIN SODIUM 40 MG/0.4ML IJ SOSY
40.0000 mg | PREFILLED_SYRINGE | INTRAMUSCULAR | Status: DC
  Administered 2024-09-08: 40 mg via SUBCUTANEOUS
  Filled 2024-09-07 (×2): qty 0.4

## 2024-09-07 MED ORDER — PANTOPRAZOLE SODIUM 40 MG IV SOLR
40.0000 mg | INTRAVENOUS | Status: DC
  Administered 2024-09-07 – 2024-09-08 (×2): 40 mg via INTRAVENOUS
  Filled 2024-09-07 (×2): qty 10

## 2024-09-07 MED ORDER — GABAPENTIN 100 MG PO CAPS
200.0000 mg | ORAL_CAPSULE | Freq: Every day | ORAL | Status: DC | PRN
  Administered 2024-09-07: 200 mg via ORAL
  Filled 2024-09-07: qty 2

## 2024-09-07 NOTE — ED Provider Notes (Signed)
 Patient signed out to myself at time of shift change by previous EDP Rebekah Sponseller PA-C, please see her note for further detail.  Briefly patient is a 54 year old male with a history of multiple sclerosis who presented to the emergency department with a chief complaint of diplopia in the right eye for 1 week as well as progressively worsening right eye pain.  Patient has had optic neuritis in the past and is concerned this may be the origin of his symptoms.  Patient also reports a headache.  Patient reports that he is on no active meds and has not had an MS flare since 2011.  States that he has also noticed that he cannot track his right eye past midline.  Labs completed by previous EDP which were largely unremarkable, MRI brain as well as MRI orbits completed which showed no evidence of optic neuritis, however scans did show findings consistent with known MS, no evidence of active demyelination, as well as a small focal area of enhancement within the right anterior/inferior pons.  These findings were discussed with Dr. Vanessa who states findings are consistent with acute MS flare with new focus of enhancement in the pons which could be prompting patient symptoms.  Recommends hospital admission to medicine team, oncoming a.m. neurologist will consult formally, orders for steroids placed.   Consult placed to hospitalist, spoke with Dr. Kandis with internal medicine who will admit with neurology consulting.    Janetta Terrall FALCON, PA-C 09/07/24 [id number]    Rogelia Jerilynn RAMAN, MD 09/08/24 [phone]

## 2024-09-07 NOTE — Consult Note (Signed)
 NEUROLOGY CONSULT NOTE   Date of service: September 07, 2024 Patient Name: Colin Brown MRN:  [id number] DOB:  19-Sep-1970 Chief Complaint: Horizontal diplopia and right eye pressure Requesting Provider: Shawn Sick, MD  History of Present Illness  Colin Brown is a 54 y.o. male with hx of multiple sclerosis with no flares since 2011 who presents with a 1 week history of horizontal diplopia, holocephalic headache and pressure behind the right eye.  Patient reports that pain and pressure behind the right eye were initially similar to what he had felt with an episode of optic neuritis that he had had in the past.  Headache has since resolved, and patient reports only slight pressure behind the right eye.  Diplopia is binocular and horizontal.  He has been off disease-modifying therapy for multiple sclerosis for several years.  He reports no recent illnesses and no other recent symptoms.  ROS  Comprehensive ROS performed and pertinent positives documented in HPI   Past History   Past Medical History:  Diagnosis Date   Depression    Multiple sclerosis    Neuromuscular disorder (HCC)     History reviewed. No pertinent surgical history.  Family History: Family History  Problem Relation Age of Onset   Hypertension Mother     Social History  reports that he has never smoked. He does not have any smokeless tobacco history on file. He reports current alcohol use. He reports that he does not use drugs.  Allergies[1]  Medications  Current Medications[2]  Vitals   Vitals:   September 22, 2024 2214 09/07/24 0126 09/07/24 0223 09/07/24 0659  BP: (!) 133/96 (!) 126/94 (!) 120/93 122/78  Pulse: 85 78 73 70  Resp: 17 16 16 16   Temp: 98.7 F (37.1 C) 97.8 F (36.6 C) 98.6 F (37 C) 97.6 F (36.4 C)  TempSrc: Oral  Oral Oral  SpO2: 98% 98% 97% 100%    There is no height or weight on file to calculate BMI.   Physical Exam   Constitutional: Appears well-developed and well-nourished.  Psych:  Affect appropriate to situation.  Eyes: No scleral injection.  HENT: No OP obstruction.  Head: Normocephalic.  Respiratory: Effort normal, non-labored breathing.  Skin: WDI.   Neurologic Examination    NEURO:  Mental Status: AA&Ox3, able to give clear and coherent history of present illness Speech/Language: speech is without dysarthria or aphasia.    Cranial Nerves:  II: PERRL. Visual fields full.  III, IV, VI: Right 6th nerve palsy V: Sensation is intact to light touch and symmetrical to face.  VII: Smile is symmetrical.  VIII: hearing intact to voice. IX, X: Phonation is normal.  KP:Dynloizm shrug 5/5. XII: tongue is midline without fasciculations. Motor: 5/5 strength to all muscle groups tested.  Tone: is normal and bulk is normal Sensation- Intact to light touch bilaterally.  Coordination: FTN intact bilaterally Gait- deferred   Labs/Imaging/Neurodiagnostic studies   CBC:  Recent Labs  Lab September 22, 2024 2000  WBC 8.8  NEUTROABS 5.0  HGB 15.7  HCT 45.8  MCV 89.5  PLT 326   Basic Metabolic Panel:  Lab Results  Component Value Date   NA 138 09-22-24   K 3.8 09-22-24   CO2 27 09/22/2024   GLUCOSE 84 09-22-2024   BUN 17 2024-09-22   CREATININE 1.20 2024/09/22   CALCIUM 9.6 2024-09-22   GFRNONAA >60 09-22-24   GFRAA >60 06/30/2017    MRI Brain and orbits (Personally reviewed): Moderate white matter disease with a small focal area of  enhancement within the right anterior inferior pons, no evidence of optic neuritis  ASSESSMENT   Colin Brown is a 54 y.o. male with history of multiple sclerosis not currently on disease modifying therapy with no flares since 2011 who presents with a 1 week history of horizontal diplopia, right 6th nerve palsy, headache and right eye pain.  Patient reports that the headache is resolved and that he has a feeling of pressure behind his right eye.  Right 6th nerve palsy is seen on exam, and MRI reveals a small area of enhancement  within the pons.  Likely etiology of 6th nerve palsy and diplopia is an MS exacerbation, we will treat with high-dose Solu-Medrol  for 5 days.  RECOMMENDATIONS  -Solu-Medrol  1000 mg daily IV x 5 days - PPI while on Solu-Medrol  - OT evaluation - Neurology will continue to follow ______________________________________________________________________  Patient seen by NP and then by MD, MD to edit note as needed.  Signed, Cortney E Everitt Clint Kill, NP [facility]    Attending Neurohospitalist Addendum Patient seen and examined with APP/Resident. Agree with the history and physical as documented above. Agree with the plan as documented, which I helped formulate. I have edited the note above to reflect my full findings and recommendations. I have independently reviewed the chart, obtained history, review of systems and examined the patient.I have personally reviewed pertinent head/neck/spine imaging (CT/MRI). Please feel free to call with any questions.  -- Elida Ross, MD [facility] [phone]  If 7pm- 7am, please page neurology on call as listed in AMION.        [1]  Allergies Allergen Reactions   Hydromorphone Other (See Comments) and Tinitus    Ear ringing  [2]  Current Facility-Administered Medications:    enoxaparin  (LOVENOX ) injection 40 mg, 40 mg, Subcutaneous, Q24H, Bender, Emily, DO   methylPREDNISolone  sodium succinate (SOLU-MEDROL ) 1,000 mg in sodium chloride  0.9 % 50 mL IVPB, 1,000 mg, Intravenous, Q24H **AND** pantoprazole  (PROTONIX ) injection 40 mg, 40 mg, Intravenous, Q24H, Khaliqdina, Salman, MD  Current Outpatient Medications:    baclofen  (LIORESAL ) 20 MG tablet, Take 20 mg by mouth daily as needed for muscle spasms., Disp: , Rfl:    Cholecalciferol  (VITAMIN D3 SUPER STRENGTH) 50 MCG (2000 UT) TABS, Take 1 tablet by mouth daily., Disp: , Rfl:    cyanocobalamin 2000 MCG tablet, Take 2,000 mcg by mouth daily., Disp: , Rfl:     gabapentin  (NEURONTIN ) 100 MG capsule, Take 200 mg by mouth daily as needed (pain)., Disp: , Rfl:    naproxen sodium (ALEVE) 220 MG tablet, Take 220-440 mg by mouth daily as needed (pain)., Disp: , Rfl:

## 2024-09-07 NOTE — Plan of Care (Signed)

## 2024-09-07 NOTE — ED Provider Notes (Signed)
 " [facility] Provider Note   CSN: [id number] Arrival date & time: 09/06/24  1853     Patient presents with: Diplopia and Headache   Colin Brown is a 54 y.o. male with history of multiple sclerosis who presents with concern for diplopia in the right eye for 1 week and progressively worsening right eye pain which he states feels similar to his prior episodes of optic neuritis.  Also has a headache. States no active meds, no flares since 2011. Also states he cannot track his right eye past midline.     HPI     Prior to Admission medications  Medication Sig Start Date End Date Taking? Authorizing Provider  clindamycin  (CLEOCIN ) 150 MG capsule Take 2 capsules (300 mg total) by mouth 3 (three) times daily. May dispense as 150mg  capsules 06/30/17   Vicky Charleston, PA-C  escitalopram (LEXAPRO) 20 MG tablet Take 20 mg by mouth daily.    [provider]  HYDROcodone -acetaminophen  (NORCO/VICODIN) 5-325 MG tablet Take 1-2 tablets by mouth every 6 (six) hours as needed. 07/01/17   Vicky Charleston, PA-C  Pseudoephedrine-APAP-DM 30-325-15 MG CAPS Take 2 capsules by mouth every 8 (eight) hours as needed (cold).    [provider]    Allergies: Patient has no known allergies.    Review of Systems  Eyes:  Positive for pain and visual disturbance.    Updated Vital Signs BP (!) 120/93 (BP Location: Left Arm)   Pulse 73   Temp 98.6 F (37 C) (Oral)   Resp 16   SpO2 97%   Physical Exam Vitals and nursing note reviewed.  Constitutional:      Appearance: He is not ill-appearing or toxic-appearing.  HENT:     Head: Normocephalic and atraumatic.     Mouth/Throat:     Mouth: Mucous membranes are moist.     Pharynx: No oropharyngeal exudate or posterior oropharyngeal erythema.  Eyes:     General: Lids are normal.        Right eye: No discharge.        Left eye: No discharge.     Extraocular Movements:     Right eye: Abnormal  extraocular motion present.     Conjunctiva/sclera: Conjunctivae normal.     Pupils: Pupils are equal, round, and reactive to light.     Comments: Cannot track the R eye laterally past midline.   Cardiovascular:     Rate and Rhythm: Normal rate and regular rhythm.     Pulses: Normal pulses.     Heart sounds: Normal heart sounds.  Pulmonary:     Effort: Pulmonary effort is normal. No respiratory distress.     Breath sounds: Normal breath sounds. No wheezing or rales.  Abdominal:     General: Bowel sounds are normal. There is no distension.     Tenderness: There is no abdominal tenderness.  Musculoskeletal:        General: No deformity.     Cervical back: Neck supple.  Skin:    General: Skin is warm and dry.  Neurological:     Mental Status: He is alert and oriented to person, place, and time. Mental status is at baseline.     GCS: GCS eye subscore is 4. GCS verbal subscore is 5. GCS motor subscore is 6.     Cranial Nerves: Cranial nerve deficit present. No dysarthria or facial asymmetry.     Sensory: Sensation is intact.     Motor: Motor  function is intact.     Coordination: Coordination is intact.     Comments: Gait deferred for patient safety.   Psychiatric:        Mood and Affect: Mood normal.     (all labs ordered are listed, but only abnormal results are displayed) Labs Reviewed  CBC WITH DIFFERENTIAL/PLATELET  BASIC METABOLIC PANEL WITH GFR    EKG: None  Radiology: MR ORBITS W WO CONTRAST Result Date: 09/07/2024 EXAM: MRI ORBITS WITHOUT AND WITH CONTRAST 09/07/2024 05:30:28 AM TECHNIQUE: Multiplanar, multisequence MRI of the orbits was performed without and with intravenous contrast. 6 mL of gadobutrol  (GADAVIST ) 1 MMOL/ML injection was administered. COMPARISON: None available CLINICAL HISTORY: Diplopia and pain in right eye, eval possible optic neuritis. FINDINGS: ORBITS: Normal globes. Lenses are normally located. The optic nerves appear normal in size, morphology and  signal intensity. There is no evidence of optic neuritis. The optic chiasm is unremarkable. The extraocular muscles are normal in morphology and signal intensity. No orbital mass. No abnormal enhancement. SINUSES AND MASTOIDS: Clear. BRAIN: There is a small focal area of enhancement present inferiorly and anteriorly within the pons on the right, but it would not explain a sixth nerve palsy. No acute abnormality. BONES AND SOFT TISSUES: Normal bone marrow signal. No acute soft tissue abnormality. IMPRESSION: 1. No evidence of optic neuritis. 2. Small focal area of enhancement within the right anterior/inferior pons, not explaining a sixth nerve palsy. Electronically signed by: Evalene Coho MD 09/07/2024 06:07 AM EST RP Workstation: [id number]   MR Brain W and Wo Contrast Result Date: 09/07/2024 EXAM: MRI BRAIN WITH AND WITHOUT CONTRAST 09/07/2024 05:29:58 AM TECHNIQUE: Multiplanar multisequence MRI of the head/brain was performed with and without the administration of intravenous contrast (gadobutrol  1 MMOL/ML injection 6 mL). COMPARISON: None available. CLINICAL HISTORY: Diplopia in right eye with cranial nerve 6 palsy, history of MS. FINDINGS: BRAIN AND VENTRICLES: No acute infarct. No acute intracranial hemorrhage. No mass effect or midline shift. No hydrocephalus. The sella is unremarkable. Normal flow voids. There is moderate periventricular, subcortical and deep cerebral white matter disease. There is also involvement of the pons. None of the lesions demonstrates restricted diffusion or contrast enhancement. There is no abnormal parenchymal or meningeal enhancement. No mass. ORBITS: No acute abnormality. SINUSES: No acute abnormality. BONES AND SOFT TISSUES: Normal bone marrow signal and enhancement. No acute soft tissue abnormality. IMPRESSION: 1. Moderate periventricular, subcortical, and deep cerebral white matter disease, including involvement of the pons, without restricted diffusion or contrast  enhancement. The findings are compatible with the clinical diagnosis of multiple sclerosis. There is no evidence of active demyelination. 2. No abnormal parenchymal or meningeal enhancement. Electronically signed by: Evalene Coho MD 09/07/2024 05:55 AM EST RP Workstation: [id number]     Procedures   Medications Ordered in the ED  methylPREDNISolone  sodium succinate (SOLU-MEDROL ) 1,000 mg in sodium chloride  0.9 % 50 mL IVPB (has no administration in time range)    And  pantoprazole  (PROTONIX ) injection 40 mg (has no administration in time range)  LORazepam  (ATIVAN ) injection 1 mg (1 mg Intravenous Given 09/07/24 0436)  gadobutrol  (GADAVIST ) 1 MMOL/ML injection 6 mL (6 mLs Intravenous Contrast Given 09/07/24 0519)    Clinical Course as of 09/07/24 0646  Sun Sep 07, 2024  0630 Consult to neurologist Dr.  Vanessa, who states there is acute MS flare with new focus of enhancement in the in the pons which would be prompting the patient symptoms.  He will require admission to hospital medicine.  Oncoming a.m. neurologist will consult on the patient and place formal recommendations and consultation note.  I appreciate his collaboration in the care of this patient.  [RS]  [phone] Acute MS flare, hospitalist consult pending, talked with Dr. Sal with neuro, day team will see the patient around 7am, steroids already placed, admit to medicine with neuro consulting [CH]    Clinical Course User Index [CH] Hinnant, Collin F, PA-C [RS] Jaasia Viglione, Pleasant SAUNDERS, PA-C                                 Medical Decision Making 54 y/o male who presents with concern for visual disturbance and R eye pain.   HTN on intake, VS otherwise normal. Neuro exam as above.   Differential diagnosis for monocular diplopia includes but is not limited to macular disruption, MS flare, cataracts, lens dislocation, CVA, cranial nerve palsy.  Amount and/or Complexity of Data Reviewed Labs:     Details: CBC, BMP  unremarkable. Radiology:     Details: MRI with acute area of focal enhancement in the pons on the right is representing acute MS flare.  Risk Decision regarding hospitalization.   The patient require admission for IV steroids for acute MS flare.  Hospitalist admission with neurology consultation per neurologist recommendation.  Patient pending hospitalist consult time of shift change.  Care of this patient signed out to oncoming ED provider Terrall, PA-C at time of shift change. All pertinent HPI, physical exam, and laboratory findings were discussed with them prior to my departure. Disposition of patient pending completion of workup, reevaluation, and clinical judgement of oncoming ED provider.   This chart was dictated using voice recognition software, Dragon. Despite the best efforts of this provider to proofread and correct errors, errors may still occur which can change documentation meaning.      Final diagnoses:  Multiple sclerosis exacerbation    ED Discharge Orders     None          Bobette Pleasant SAUNDERS DEVONNA 09/07/24 9353    Jerral Meth, MD 09/07/24 2302  "

## 2024-09-07 NOTE — H&P (Addendum)
 " Date: 09/07/2024               Patient Name:  Colin Brown MRN: [id number]  DOB: 11-24-1970 Age / Sex: 54 y.o., male   PCP: No primary care provider on file.         Medical Service: Internal Medicine Teaching Service         Attending Physician: Dr. Jone Dauphin      First Contact: Viktoria King, DO    Second Contact: Dr. Damien Lease, DO         Pager Information: First Contact Pager: [phone]   Second Contact Pager: [phone]   SUBJECTIVE   Chief Complaint: diplopia for a week  History of Present Illness: Quavion Boule is a 54 y.o. male with PMH of multiple sclerosis in remission since 2011 and not on therapy, peripheral neuropathy and HLD.   Accompanied by girlfriend.   Felt like having MS flare, he called his neurologist but they don't have availability until Jan 14th and he felt like he needed help sooner so came to the ED.   Presents with one week of diplopia and waxing and waning headache. He started noticing diplopia about a week ago accompanied by a headache that changes location. He took naproxen which initially helped with the HA but the only thing that has consistently helped is wearing an eye patch over his right eye which rids of the diplopia. He first noticed the diplopia in his right peripheral vision and feels like it has come more central as the week has gone on. He can see fine close up but starts seeing two with more distant vision. Has chronic right jaw popping with chewing but no increase or change in pain in the last week or so. Has R ear pressure with mild pain with touch. It does hurt to press on his head where the headache is. Also has associated pressure behind his right eye. He has noticed some cognitive slowing over the last week, slow to find words and process. He thinks this may be due to seeing double. Has also had difficulty ambulating and stubbed his toe but doesn't have this problem when he wears the eye patch.   He has had ON in the past, last time was  around 2011 and he had similar symptoms but he also had photophobia which is not present currently. The last time he had ON he had a streak of vision loss in his right eye which is also not currently present. He has no pain with EOM. He has chronic LE numbness and tingling that he takes prn gabapentin , baclofen  and pot for, also has chronic wax/waning numbness in his hands. Attributes both to MS.   Has had some holiday stress but no other life stressors recently. The last time he was sick was around Thanksgiving. When the symptoms started he resumed D3 and B12. He isn't on any medications otherwise, supposed to take atorvastatin but hasn't in over a year.   Denies SOB, heart palpitations, extremity weakness, pain with EOM, vision loss, new meds (Aside from vitamins above), abdominal pain, or sinus congestion.    ED Course: Labs significant for  CBC and BMP wnl Imaging  MRI Brain no evidence of active demyelination. No abnormal parenchymal or meningeal enhancement. MRI Orbits  No evidence of optic neuritis. Small focal area of enhancement within the right anterior/inferior pons, not explaining a sixth nerve palsy. Received  Lorazepam  for MRI, methylprednisolone , pantoprazole , baclofen , gabapentin , naproxen, B12, Vit D Consulted  IMTS, neuro  Meds:  Patient reported:  One week of B12 and D3 for MS symptoms otherwise no meds PTA  Past Medical History MS Peripheral Neuropathy HLD  Past Surgical History History reviewed. No pertinent surgical history.   Social:  Lives With: dog and daughter at his house but spends a lot of time at girlfriends house Occupation: doesn't work due to [employer] Support: girlfriend Level of Function: independent  PCP: [place] No primary care provider on file.  Substances: -Tobacco: no -Alcohol: rarely -Recreational Drug: marijuana tincture prn for neuropathy   Family History:  Family History  Problem Relation Age of Onset   Hypertension Mother       Allergies: Allergies as of 09/06/2024   (No Known Allergies)    Review of Systems: A complete ROS was negative except as per HPI.   OBJECTIVE:   Physical Exam: Blood pressure 122/78, pulse 70, temperature 97.6 F (36.4 C), temperature source Oral, resp. rate 16, SpO2 100%.  Constitutional: well-appearing male lying in bed, in no acute distress HENT: normocephalic atraumatic, mucous membranes moist, no pain with palpation of either temporal artery  Eyes: conjunctiva non-erythematous, grossly normal appearing, no pain with EOM Neck: supple Pulmonary/Chest: normal work of breathing on room air Abdominal: soft, non-tender, non-distended MSK: normal bulk and tone Neurological: alert & oriented x 3, 5/5 strength in bilateral upper and lower extremities CN II-V intact CN VI: limited R eye abduction, normal L eye CN VII-XII intact Negative for Afferent pupillary defect Skin: warm and dry Psych: mood and affect congruent  Labs: CBC    Component Value Date/Time   WBC 8.8 09/06/2024 2000   RBC 5.12 09/06/2024 2000   HGB 15.7 09/06/2024 2000   HCT 45.8 09/06/2024 2000   PLT 326 09/06/2024 2000   MCV 89.5 09/06/2024 2000   MCV 93.9 09/16/2012 1057   MCH 30.7 09/06/2024 2000   MCHC 34.3 09/06/2024 2000   RDW 12.5 09/06/2024 2000   LYMPHSABS 2.7 09/06/2024 2000   MONOABS 0.6 09/06/2024 2000   EOSABS 0.4 09/06/2024 2000   BASOSABS 0.0 09/06/2024 2000     CMP     Component Value Date/Time   NA 138 09/06/2024 2000   K 3.8 09/06/2024 2000   CL 102 09/06/2024 2000   CO2 27 09/06/2024 2000   GLUCOSE 84 09/06/2024 2000   BUN 17 09/06/2024 2000   CREATININE 1.20 09/06/2024 2000   CALCIUM 9.6 09/06/2024 2000   GFRNONAA >60 09/06/2024 2000   GFRAA >60 06/30/2017 2218    Imaging:  MR ORBITS W WO CONTRAST Result Date: 09/07/2024 EXAM: MRI ORBITS WITHOUT AND WITH CONTRAST 09/07/2024 05:30:28 AM TECHNIQUE: Multiplanar, multisequence MRI of the orbits was performed without  and with intravenous contrast. 6 mL of gadobutrol  (GADAVIST ) 1 MMOL/ML injection was administered. COMPARISON: None available CLINICAL HISTORY: Diplopia and pain in right eye, eval possible optic neuritis. FINDINGS: ORBITS: Normal globes. Lenses are normally located. The optic nerves appear normal in size, morphology and signal intensity. There is no evidence of optic neuritis. The optic chiasm is unremarkable. The extraocular muscles are normal in morphology and signal intensity. No orbital mass. No abnormal enhancement. SINUSES AND MASTOIDS: Clear. BRAIN: There is a small focal area of enhancement present inferiorly and anteriorly within the pons on the right, but it would not explain a sixth nerve palsy. No acute abnormality. BONES AND SOFT TISSUES: Normal bone marrow signal. No acute soft tissue abnormality. IMPRESSION: 1. No evidence of optic neuritis. 2. Small focal  area of enhancement within the right anterior/inferior pons, not explaining a sixth nerve palsy. Electronically signed by: Evalene Coho MD 09/07/2024 06:07 AM EST RP Workstation: [id number]   MR Brain W and Wo Contrast Result Date: 09/07/2024 EXAM: MRI BRAIN WITH AND WITHOUT CONTRAST 09/07/2024 05:29:58 AM TECHNIQUE: Multiplanar multisequence MRI of the head/brain was performed with and without the administration of intravenous contrast (gadobutrol  1 MMOL/ML injection 6 mL). COMPARISON: None available. CLINICAL HISTORY: Diplopia in right eye with cranial nerve 6 palsy, history of MS. FINDINGS: BRAIN AND VENTRICLES: No acute infarct. No acute intracranial hemorrhage. No mass effect or midline shift. No hydrocephalus. The sella is unremarkable. Normal flow voids. There is moderate periventricular, subcortical and deep cerebral white matter disease. There is also involvement of the pons. None of the lesions demonstrates restricted diffusion or contrast enhancement. There is no abnormal parenchymal or meningeal enhancement. No mass. ORBITS: No  acute abnormality. SINUSES: No acute abnormality. BONES AND SOFT TISSUES: Normal bone marrow signal and enhancement. No acute soft tissue abnormality. IMPRESSION: 1. Moderate periventricular, subcortical, and deep cerebral white matter disease, including involvement of the pons, without restricted diffusion or contrast enhancement. The findings are compatible with the clinical diagnosis of multiple sclerosis. There is no evidence of active demyelination. 2. No abnormal parenchymal or meningeal enhancement. Electronically signed by: Evalene Coho MD 09/07/2024 05:55 AM EST RP Workstation: [id number]     ASSESSMENT & PLAN:   Assessment & Plan by Problem: Principal Problem:   Multiple sclerosis exacerbation Active Problems:   Vitamin D  deficiency   Leonid Manus is a 54 y.o. person living with a history of HLD and MS not on disease modifying therapy who presented with diplopia and headache and admitted for MS exacerbation on hospital day 0  #Multiple Sclerosis Exacerbation  #Right CN VI Palsy #Vitamin D  Deficiency Follows with [facility]. 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 Started on Glatiramer and eventually opted to d/c medication in 2017 and has been stable thus far. Last routine MRI in 03/2024 was stable. Admission MRI brain without evidence of active demyelination or meningeal enhancement. MRI orbits with enhancement of right pons, not explaining CN VI palsy. HDS and afebrile, exam not concerning for infectious etiology. Exam without other focal deficits or temporal artery pain or new jaw claudication. Headache and ataxia correlate with diplopia and relived by right eye patch. Negative for APD. CN VI palsy likely in the setting of MS exacerbation as most occur without MRI changes. Neurology consulted in ED and will follow. Will order vitamin D  and B12 labs to guide therapy  - neuro following, appreciate recommendations -  methylprednisolone  1000 mg IV for 5 days EOT 1/8 - pantoprazole  40 mg IV for GI protection while on steroids  - FU B12, 25OHD, Mg - CBC and BMP in AM - OT eval - vitamin D  1000 international units daily   #Peripheral Neuropathy Chronic. Controlled at home with prn gabapentin  and baclofen .  -Continue home gabapentin  200 mg prn -Continue home baclofen  20 mg daily prn   Best practice: Diet: Normal VTE: Enoxaparin  IVF: None,None Code: Full  Disposition planning: Prior to Admission Living Arrangement: Home, living with dog Anticipated Discharge Location: Home  Dispo: Admit patient to Observation with expected length of stay less than 2 midnights.  Signed: Charmayne Holmes, DO Internal Medicine Resident  09/07/2024, 9:02 AM  On Call pager: [phone]   "

## 2024-09-07 NOTE — Hospital Course (Signed)
 MS flare Last flare was 2011 Not on treatment Double vision for one week: similar to prior optic neuritis  Headache  Right not able to track  MS flare per Neuro, new pons enhancement  -Med admission -Roid treatment

## 2024-09-08 ENCOUNTER — Other Ambulatory Visit (HOSPITAL_COMMUNITY): Payer: Self-pay

## 2024-09-08 ENCOUNTER — Encounter (HOSPITAL_COMMUNITY): Payer: Self-pay

## 2024-09-08 DIAGNOSIS — G35D Multiple sclerosis, unspecified: Secondary | ICD-10-CM | POA: Diagnosis not present

## 2024-09-08 LAB — CBC
HCT: 41.1 % (ref 39.0–52.0)
Hemoglobin: 14.4 g/dL (ref 13.0–17.0)
MCH: 30.8 pg (ref 26.0–34.0)
MCHC: 35 g/dL (ref 30.0–36.0)
MCV: 87.8 fL (ref 80.0–100.0)
Platelets: 325 K/uL (ref 150–400)
RBC: 4.68 MIL/uL (ref 4.22–5.81)
RDW: 12.7 % (ref 11.5–15.5)
WBC: 13.1 K/uL — ABNORMAL HIGH (ref 4.0–10.5)
nRBC: 0 % (ref 0.0–0.2)

## 2024-09-08 LAB — BASIC METABOLIC PANEL WITH GFR
Anion gap: 13 (ref 5–15)
BUN: 25 mg/dL — ABNORMAL HIGH (ref 6–20)
CO2: 21 mmol/L — ABNORMAL LOW (ref 22–32)
Calcium: 9.2 mg/dL (ref 8.9–10.3)
Chloride: 102 mmol/L (ref 98–111)
Creatinine, Ser: 1.31 mg/dL — ABNORMAL HIGH (ref 0.61–1.24)
GFR, Estimated: >60 mL/min
Glucose, Bld: 142 mg/dL — ABNORMAL HIGH (ref 70–99)
Potassium: 4.4 mmol/L (ref 3.5–5.1)
Sodium: 136 mmol/L (ref 135–145)

## 2024-09-08 LAB — VITAMIN B12: Vitamin B-12: 1165 pg/mL — ABNORMAL HIGH (ref 180–914)

## 2024-09-08 LAB — MAGNESIUM: Magnesium: 2 mg/dL (ref 1.7–2.4)

## 2024-09-08 MED ORDER — ONDANSETRON 4 MG PO TBDP
4.0000 mg | ORAL_TABLET | Freq: Three times a day (TID) | ORAL | 0 refills | Status: AC | PRN
  Filled 2024-09-08: qty 20, 7d supply, fill #0

## 2024-09-08 MED ORDER — ONDANSETRON 4 MG PO TBDP
4.0000 mg | ORAL_TABLET | Freq: Three times a day (TID) | ORAL | Status: DC | PRN
  Administered 2024-09-08: 4 mg via ORAL
  Filled 2024-09-08: qty 1

## 2024-09-08 MED ORDER — PANTOPRAZOLE SODIUM 40 MG PO TBEC
40.0000 mg | DELAYED_RELEASE_TABLET | Freq: Every day | ORAL | 0 refills | Status: AC
  Filled 2024-09-08: qty 7, 7d supply, fill #0

## 2024-09-08 MED ORDER — PREDNISONE 50 MG PO TABS
1250.0000 mg | ORAL_TABLET | Freq: Every day | ORAL | 0 refills | Status: AC
  Filled 2024-09-08: qty 75, 3d supply, fill #0

## 2024-09-08 NOTE — Evaluation (Signed)
 Physical Therapy Brief Evaluation and Discharge Note Patient Details Name: Colin Brown MRN: [id number] DOB: Oct 12, 1970 Today's Date: 09/08/2024   History of Present Illness  Colin Brown is a 54 y.o. male with history of MS not currently on disease modifying therapy with no flare since 2011 who presented with a 1 week history of horizontal diplopia, right nerve palsy, headache and right eye pain. Patient reported that headache is resolved and pressure behind his right eye is only intermittent now.  Diplopia is still present but is slightly improving, and improved range of motion of the right eye is noted on exam  Clinical Impression  Patient evaluated by Physical Therapy with no further acute PT needs identified. All education has been completed and the patient has no further questions. Previously independent, reporting occasional stumble and some residual LE weakness from initial MS flair years ago but completed rehab and had weaned off SPC. Pt mobilizing at a mod I to fully Independent level. Navigates room and hallway without need for physical assistance, showing some minor instability at times, exacerbated by head turns, nods, and when right eye is open (wearing patch recently over Rt eye.) Educated on safety and follow-up recs. He has excellent awareness and is compensating well. See below for any follow-up Physical Therapy or equipment needs. PT is signing off. Thank you for this referral.        PT Assessment All further PT needs can be met in the next venue of care  Assistance Needed at Discharge  PRN    Equipment Recommendations None recommended by PT  Recommendations for Other Services       Precautions/Restrictions Precautions Precautions: None Recall of Precautions/Restrictions: Intact Restrictions Weight Bearing Restrictions Per Provider Order: No        Mobility  Bed Mobility   Supine/Sidelying to sit: Independent Sit to supine/sidelying: Independent General bed  mobility comments: Independent with bed mobility  Transfers Overall transfer level: Modified independent Equipment used: None               General transfer comment: Mod I to stand, no increased sway noted. Able to sit back down on bed safely without assistance.    Ambulation/Gait Ambulation/Gait assistance: Modified independent (Device/Increase time) Gait Distance (Feet): 325 Feet Assistive device: None Gait Pattern/deviations: Step-through pattern, Drifts right/left Gait Speed: Pace WFL General Gait Details: Grossly stable, pt closing Rt eye to help with sense of equilibrium (visual feedback.) Ambulates safely this way and has been wearing eye patch to help compensate. With Rt eye open and symptomatic with diplopia,  he does reduce gait speed and have a more pronounced deviation from straight path but no overt buckling or LOB that he cannot recover on his own. Exacerbated further by head turns/nods. Educated on safety and awareness. Will continue to correct via OT recs for visual recovery.  Home Activity Instructions Home Activity Instructions: Gradually increase activity - night light in dim areas, use of rail on stairs, OP neuro rehab follow-up.  Stairs            Modified Rankin (Stroke Patients Only)        Balance Overall balance assessment: Mild deficits observed, not formally tested Sitting-balance support: No upper extremity supported, Feet supported Sitting balance-Leahy Scale: Normal     Standing balance support: No upper extremity supported Standing balance-Leahy Scale: Good            Pertinent Vitals/Pain   Pain Assessment Pain Assessment: No/denies pain     Home Living Family/patient  expects to be discharged to:: Private residence Living Arrangements: Spouse/significant other;Children Available Help at Discharge: Family Home Environment: Stairs to enter  [place] of Steps: 4 Home Equipment: Cane - single point   Additional  Comments: pt plans on getting grab bars in shower    Prior Function Level of Independence: Independent Comments: Denies falls but has had some stumbling. No longer needing SPC after rehab in the past. Notices he has to take his time to do things safely.    UE/LE Assessment        LE ROM/Strength/Tone/Coordination: [facility]      Communication   Communication Communication: No apparent difficulties     Cognition Overall Cognitive Status: Appears within functional limits for tasks assessed/performed       General Comments General comments (skin integrity, edema, etc.): Time spent discussing home management, safety with self care, and follow-up recs. Feels more unstable than baseline. Navigates room and hallways without need for physical assist. Feels he may be more confident with [facility] which I recommend he use and follow-up with OP neuro rehab.    Exercises     Assessment/Plan    PT Problem List Decreased mobility;Decreased balance       PT Visit Diagnosis Other abnormalities of gait and mobility (R26.89);Other symptoms and signs involving the nervous system (R29.898)    No Skilled PT     Co-evaluation                AMPAC 6 Clicks Help needed turning from your back to your side while in a flat bed without using bedrails?: None Help needed moving from lying on your back to sitting on the side of a flat bed without using bedrails?: None Help needed moving to and from a bed to a chair (including a wheelchair)?: None Help needed standing up from a chair using your arms (e.g., wheelchair or bedside chair)?: None Help needed to walk in hospital room?: None Help needed climbing 3-5 steps with a railing? : None 6 Click Score: 24      End of Session   Activity Tolerance: Patient tolerated treatment well Patient left: in bed;with call bell/phone within reach   PT Visit Diagnosis: Other abnormalities of gait and mobility (R26.89);Other symptoms and signs involving the  nervous system (R29.898)     Time: 9056-8988 PT Time Calculation (min) (ACUTE ONLY): 28 min  Charges:   PT Evaluation $PT Eval Low Complexity: 1 Low PT Treatments $Self Care/Home Management: 8-22    Leontine Roads, PT, DPT [facility]  Rehabilitation Services Physical Therapist Office: [phone] Website: [email or web address]   Leontine GORMAN Roads  09/08/2024, 1:22 PM

## 2024-09-08 NOTE — Progress Notes (Signed)
 NEUROLOGY CONSULT FOLLOW UP NOTE   Date of service: September 08, 2024 Patient Name: Colin Brown MRN:  [id number] DOB:  1971-02-05  Interval Hx/subjective   Patient has remained hemodynamically stable and afebrile overnight.  He reports slight improvement in his visual symptoms with improved range of motion of his right eye.  He did have some nausea and vomiting after getting his Solu-Medrol  yesterday but stated that it was on an empty stomach. Vitals   Vitals:   09/07/24 1552 09/07/24 2042 09/08/24 0523 09/08/24 0749  BP: 110/77 109/75 128/76 125/73  Pulse: 88 95 83 86  Resp: 16 17  18   Temp: 97.8 F (36.6 C) 98.2 F (36.8 C)  97.8 F (36.6 C)  TempSrc:  Oral  Oral  SpO2: 96% 96% 98% 97%     There is no height or weight on file to calculate BMI.  Physical Exam   Constitutional: Appears well-developed and well-nourished.  Psych: Affect appropriate to situation.  Eyes: No scleral injection.  HENT: No OP obstrucion.  Head: Normocephalic.  Respiratory: Effort normal, non-labored breathing.  Skin: WDI.   Neurologic Examination    NEURO:  Mental Status: AA&Ox3, able to give clear and coherent history of present illness Speech/Language: speech is without dysarthria or aphasia.    Cranial Nerves:  II: PERRL. Visual fields full. III, IV, VI: Impaired abduction of right eye, but this is improved since yesterday.  Continued horizontal diplopia V: Sensation is intact to light touch and symmetrical to face.  VII: Smile is symmetrical.  VIII: hearing intact to voice. IX, X: Phonation is normal.  KP:Dynloizm shrug 5/5. XII: tongue is midline without fasciculations. Motor: Able to move all 4 extremities with antigravity strength, left leg straightly weaker than right but this is baseline per patient Tone: is normal and bulk is normal Sensation- Intact to light touch bilaterally.    Coordination: FTN intact bilaterally,  Gait- deferred   Medications Current  Medications[1]  Labs and Diagnostic Imaging   CBC:  Recent Labs  Lab 09/06/24 2000 09/08/24 0528  WBC 8.8 13.1*  NEUTROABS 5.0  --   HGB 15.7 14.4  HCT 45.8 41.1  MCV 89.5 87.8  PLT 326 325    Basic Metabolic Panel:  Lab Results  Component Value Date   NA 136 09/08/2024   K 4.4 09/08/2024   CO2 21 (L) 09/08/2024   GLUCOSE 142 (H) 09/08/2024   BUN 25 (H) 09/08/2024   CREATININE 1.31 (H) 09/08/2024   CALCIUM 9.2 09/08/2024   GFRNONAA >60 09/08/2024   GFRAA >60 06/30/2017     MRI Brain and orbits (Personally reviewed): Moderate white matter disease with a small focal area of enhancement within the right anterior inferior pons, no evidence of optic neuritis  Assessment   Colin Brown is a 54 y.o. male with history of MS not currently on disease modifying therapy with no flare since 2011 who presents with a 1 week history of horizontal diplopia, right nerve palsy, headache and right eye pain.  Patient reports that headache is resolved and pressure behind his right eye is only intermittent now.  Diplopia is still present but is slightly improving, and improved range of motion of the right eye is noted on exam today.  Patient did have some nausea and vomiting with Solu-Medrol  administration on an empty stomach yesterday, today he will try to eat breakfast before steroid administration, and if he is able to tolerate it without nausea and vomiting, will discharge home with p.o. prednisone  1250  mg daily, Protonix  for GI prophylaxis and Zofran  for nausea.  Recommendations  -Give Solu-Medrol  1000 mg IV today, changed to 1250 mg of p.o. prednisone  for the next 3 days - Continue Protonix  for 1 week - As needed Zofran  for nausea - OT evaluation - Neurology will be available as needed.  ______________________________________________________________________ Patient seen by NP and then by MD, MD to edit note as needed.  Signed, Cortney E Everitt Clint Kill, NP [facility]    I  have seen the pt and reviewed the above note. He has been off of DMT for years, previously on:  Rebif(intolerance 2/2 flu like symptoms) Copaxone(tolelrated well) Tysabri(did not tolerate and JC+)  He will likely need to resume DMT in response to this event. He wishes to establish care here locally. He can finish his steroids orally as outpt.   I have requested oupt follow up with [facility].   Aisha Seals, MD [facility]   If 7pm- 7am, please page neurology on call as listed in AMION.      [1]  Current Facility-Administered Medications:    acetaminophen  (TYLENOL ) tablet 650 mg, 650 mg, Oral, Q6H PRN, Bender, Emily, DO, 650 mg at 09/07/24 1534   baclofen  (LIORESAL ) tablet 20 mg, 20 mg, Oral, Daily PRN, Lyles, Elliott, DO, 20 mg at 09/07/24 1950   cholecalciferol  (VITAMIN D3) 25 MCG (1000 UNIT) tablet 1,000 Units, 1,000 Units, Oral, Daily, Lyles, Elliott, DO   enoxaparin  (LOVENOX ) injection 40 mg, 40 mg, Subcutaneous, Q24H, Bender, Emily, DO   gabapentin  (NEURONTIN ) capsule 200 mg, 200 mg, Oral, Daily PRN, Lyles, Elliott, DO, 200 mg at 09/07/24 1950   methylPREDNISolone  sodium succinate (SOLU-MEDROL ) 1,000 mg in sodium chloride  0.9 % 50 mL IVPB, 1,000 mg, Intravenous, Q24H, Stopped at 09/07/24 1021 **AND** pantoprazole  (PROTONIX ) injection 40 mg, 40 mg, Intravenous, Q24H, Khaliqdina, Salman, MD, 40 mg at 09/07/24 [phone]

## 2024-09-08 NOTE — Evaluation (Signed)
 Occupational Therapy Evaluation Patient Details Name: Colin Brown MRN: [id number] DOB: Mar 14, 1971 Today's Date: 09/08/2024   History of Present Illness   Colin Brown is a 54 y.o. male with history of MS not currently on disease modifying therapy with no flare since 2011 who presented with a 1 week history of horizontal diplopia, right nerve palsy, headache and right eye pain. Patient reported that headache is resolved and pressure behind his right eye is only intermittent now.  Diplopia is still present but is slightly improving, and improved range of motion of the right eye is noted on exam     Clinical Impressions Pt is at Ind-Mod I level with ADLs and ADL mobility, no AD needed for mobility, pt used grab bars in shower for safety. PTA pt lives with his daughter and dogs, but spends most of his time at significant other's home. Pt was Ind with all ADLs/selfcare, IADLs, drives and used no AD for mobility. Pt demos no incoordination, LOB or difficulty with UB/LB ADLs, transfers to commode and toilet. Reports that he still has double vision in R eye although it has improved and that he had been using an eye patch over R eye. Recommend OP OT for vision assessment if has not resolved within few days as pt to be d/c from acute stay this afternoon. No further acute OT services indicated at this time. OT will sign off     If plan is discharge home, recommend the following:   Assist for transportation;Assistance with cooking/housework     Functional Status Assessment   Patient has not had a recent decline in their functional status     Equipment Recommendations   None recommended by OT     Recommendations for Other Services         Precautions/Restrictions   Precautions Precautions: None Restrictions Weight Bearing Restrictions Per Provider Order: No     Mobility Bed Mobility Overal bed mobility: Independent                  Transfers Overall transfer level:  Modified independent                 General transfer comment: used grab bars to safely step in and out of shower      Balance Overall balance assessment: No apparent balance deficits (not formally assessed)                                         ADL either performed or assessed with clinical judgement   ADL Overall ADL's : Independent;At baseline                                       General ADL Comments: no incoordination, LOB or difficulty with UB/LB ADLs, transfers to commode and toilet     Vision Baseline Vision/History: 1 Wears glasses Ability to See in Adequate Light: 1 Impaired Patient Visual Report: Diplopia Additional Comments: Diplopia in R eye, pt reports that it has improved but can only see clearly less than 1 ft in front of him     Perception         Praxis         Pertinent Vitals/Pain Pain Assessment Pain Assessment: No/denies pain     Extremity/Trunk Assessment Upper Extremity Assessment Upper Extremity  Assessment: Right hand dominant;Overall WFL for tasks assessed           Communication Communication Communication: No apparent difficulties   Cognition Arousal: Alert Behavior During Therapy: WFL for tasks assessed/performed                                 Following commands: Intact       Cueing  General Comments      pt with no LOB during in room activity, but reports that he has a sway walking furhter distance without use of  R eye patch   Exercises     Shoulder Instructions      Home Living Family/patient expects to be discharged to:: Private residence Living Arrangements: Spouse/significant other Available Help at Discharge: Family;Available PRN/intermittently Type of Home: House Home Access: Stairs to enter [employer] of Steps: 3 Entrance Stairs-Rails: None Home Layout: Two level;Able to live on main level with bedroom/bathroom;Full bath on main  level Alternate Level Stairs-Number of Steps: 13+ Alternate Level Stairs-Rails: Right Bathroom Shower/Tub: Tub/shower unit;Walk-in shower   Bathroom Toilet: Standard Bathroom Accessibility: No   Home Equipment: Cane - single point   Additional Comments: pt plans on getting grab bars in shower      Prior Functioning/Environment Prior Level of Function : Independent/Modified Independent;Driving             Mobility Comments: Ind ADLs Comments: Ind    OT Problem List: Impaired vision/perception   OT Treatment/Interventions:        OT Goals(Current goals can be found in the care plan section)   Acute Rehab OT Goals Patient Stated Goal: go home   OT Frequency:       Co-evaluation              AM-PAC OT 6 Clicks Daily Activity     Outcome Measure Help from another person eating meals?: None Help from another person taking care of personal grooming?: None Help from another person toileting, which includes using toliet, bedpan, or urinal?: None Help from another person bathing (including washing, rinsing, drying)?: None Help from another person to put on and taking off regular upper body clothing?: None Help from another person to put on and taking off regular lower body clothing?: None 6 Click Score: 24   End of Session Nurse Communication: Mobility status  Activity Tolerance: Patient tolerated treatment well Patient left: in bed  OT Visit Diagnosis: Other (comment) (R eye vision impaired)                Time: 8951-8888 OT Time Calculation (min): 23 min Charges:  OT General Charges $OT Visit: 1 Visit OT Evaluation $OT Eval Low Complexity: 1 Low OT Treatments $Therapeutic Activity: 8-22 mins    Jacques Karna Loose 09/08/2024, 12:33 PM

## 2024-09-08 NOTE — Progress Notes (Signed)
 Transition of Care [facility]) - Inpatient Brief Assessment   Patient Details  Name: Colin Brown MRN: [id number] Date of Birth: May 10, 1971  [facility] CM/SW Contact:    Rosaline JONELLE Joe, RN Phone Number: 09/08/2024, 2:17 PM   Clinical Narrative: Patient admitted to the hospital for MS with cerebellar ataxia.  Patient was discharged to home.  I called and spoke with the patient by phone and he was agreeable to referral to Neuro rehabilitation for vision changes.  MD asked to co-sign the referral order.     Transition of Care Asessment: Insurance and Status: (P) Insurance coverage has been reviewed Patient has primary care physician: (P) Yes Home environment has been reviewed: (P) from home Prior level of function:: (P) self Prior/Current Home Services: (P) No current home services Social Drivers of Health Review: (P) SDOH reviewed interventions complete Readmission risk has been reviewed: (P) Yes Transition of care needs: (P) transition of care needs identified, TOC will continue to follow

## 2024-09-08 NOTE — Discharge Summary (Signed)
 "  Name: Colin Brown MRN: [id number] DOB: Jan 04, 1971 54 y.o. PCP: Moreira, Niall A, PA-C  Date of Admission: 09/06/2024  6:56 PM Date of Discharge: 09/08/2024 Attending Physician: Dr. Dayton [facility]  Discharge Diagnosis: 1. Principal Problem:   Multiple sclerosis exacerbation Active Problems:   Vitamin D  deficiency    Discharge Medications: Allergies as of 09/08/2024       Reactions   Hydromorphone Other (See Comments), Tinitus   Ear ringing        Medication List     PAUSE taking these medications    cyanocobalamin 2000 MCG tablet Wait to take this until your doctor or other care provider tells you to start again. Take 2,000 mcg by mouth daily.       TAKE these medications    baclofen  20 MG tablet Commonly known as: LIORESAL  Take 20 mg by mouth daily as needed for muscle spasms.   gabapentin  100 MG capsule Commonly known as: NEURONTIN  Take 200 mg by mouth daily as needed (pain).   naproxen sodium 220 MG tablet Commonly known as: ALEVE Take 220-440 mg by mouth daily as needed (pain).   ondansetron  4 MG disintegrating tablet Commonly known as: ZOFRAN -ODT Take 1 tablet (4 mg total) by mouth every 8 (eight) hours as needed for nausea or vomiting.   pantoprazole  40 MG tablet Commonly known as: Protonix  Take 1 tablet (40 mg total) by mouth daily.   predniSONE  50 MG tablet Commonly known as: DELTASONE  Take 25 tablets (1,250 mg total) by mouth daily. Take with breakfast!   Vitamin D3 Super Strength 50 MCG (2000 UT) Tabs Generic drug: Cholecalciferol  Take 1 tablet by mouth daily.        Disposition and follow-up:   [name] was discharged from [facility] in Good condition.  At the hospital follow up visit please address:  1.  MS Exacerbation: Ensure CN VI palsy and symptoms have resolved. Patient currently not on disease modifying therapy.  2.  Labs / imaging needed at time of follow-up: none  3.  Pending labs/ test needing  follow-up: none  Follow-up Appointments:  Follow-up Information     [facility]. Schedule an appointment as soon as possible for a visit in 2 week(s).   Specialty: Neurology Why: Please see Dr. Vear for management of MS Contact information: [address] [place]  [phone]                 Hospital Course by problem list: Colin Brown is a 54 y.o. person living with a history of MS diagnosed in 2011 not on therapy and HLD who presented with diplopia and headache and admitted for MS exacerbation now being discharged on hospital day 1 with the following pertinent hospital course:  #Multiple Sclerosis Exacerbation  #Right CN VI Palsy Follows with [facility]. 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 Started on Glatiramer and eventually opted to d/c medication in 2017 and has been stable thus far. Last routine MRI in 03/2024 was stable. Admission MRI brain without evidence of active demyelination or meningeal enhancement. MRI orbits with enhancement of right pons, not explaining CN VI palsy. HDS and afebrile, exam not concerning for infectious etiology. Exam without other focal deficits or temporal artery pain or new jaw claudication. Headache and ataxia correlate with diplopia and relived by right eye patch. Negative for afferent pupillary defect. CN VI palsy likely in the setting of  MS exacerbation as most occur without MRI changes. Neurology consulted in ED started IV steroids and pantoprazole .  On admission, 25 vitamin D  38.6, B12 1165.  After 1 night in the hospital patient is stable and was cleared by neurology for discharge with p.o. steroids and p.o. PPI with close neurology follow-up and home health PT.  Discharged on prednisone  1250 mg daily for 3 days (to complete 5 day steroid course) and pantoprazole  40 mg daily for 7 days.    #Peripheral  Neuropathy Chronic. Controlled at home with prn gabapentin  and baclofen .  -Continue home gabapentin  200 mg prn -Continue home baclofen  20 mg daily prn   Subjective He feels a lot better today. He ate breakfast this morning. Is currently getting the IV prednisone . Has not had any nausea since he vomited yesterday. He thinks he was nauseous yesterday because of all the commotion in the ED and medicines for the MRI. He feels like his eye is making progress and feels more steady on his feet. He is hopeful to going home today without patient steroids.   1/5 2026 Not nauseous, tolerating things well. Double vision.  Discharge Exam:   BP 129/79 (BP Location: Right Arm)   Pulse 91   Temp (!) 97.5 F (36.4 C) (Oral)   Resp 18   SpO2 96%  Discharge exam:  Physical Exam Vitals reviewed.  HENT:     Head: Normocephalic.     Mouth/Throat:     Mouth: Mucous membranes are moist.     Pharynx: Oropharynx is clear.  Eyes:     Pupils: Pupils are equal, round, and reactive to light.     Comments: Right eye abducen nerve palsy improving,   Pulmonary:     Effort: Pulmonary effort is normal.  Skin:    General: Skin is warm.  Neurological:     Mental Status: He is alert.     Cranial Nerves: Cranial nerve deficit present.  Psychiatric:        Mood and Affect: Mood normal.        Behavior: Behavior normal.   Pertinent Labs, Studies, and Procedures:     Latest Ref Rng & Units 09/08/2024    5:28 AM 09/06/2024    8:00 PM 06/30/2017   10:18 PM  CBC  WBC 4.0 - 10.5 K/uL 13.1  8.8  10.4   Hemoglobin 13.0 - 17.0 g/dL 85.5  84.2  85.9   Hematocrit 39.0 - 52.0 % 41.1  45.8  39.7   Platelets 150 - 400 K/uL 325  326  302        Latest Ref Rng & Units 09/08/2024    5:28 AM 09/06/2024    8:00 PM 06/30/2017   10:18 PM  CMP  Glucose 70 - 99 mg/dL 857  84  873   BUN 6 - 20 mg/dL 25  17  16    Creatinine 0.61 - 1.24 mg/dL 8.68  8.79  8.78   Sodium 135 - 145 mmol/L 136  138  139   Potassium 3.5 - 5.1 mmol/L  4.4  3.8  3.5   Chloride 98 - 111 mmol/L 102  102  104   CO2 22 - 32 mmol/L 21  27  25    Calcium 8.9 - 10.3 mg/dL 9.2  9.6  8.8     MR ORBITS W WO CONTRAST Result Date: 09/07/2024 EXAM: MRI ORBITS WITHOUT AND WITH CONTRAST 09/07/2024 05:30:28 AM TECHNIQUE: Multiplanar, multisequence MRI of the orbits was performed without and with intravenous  contrast. 6 mL of gadobutrol  (GADAVIST ) 1 MMOL/ML injection was administered. COMPARISON: None available CLINICAL HISTORY: Diplopia and pain in right eye, eval possible optic neuritis. FINDINGS: ORBITS: Normal globes. Lenses are normally located. The optic nerves appear normal in size, morphology and signal intensity. There is no evidence of optic neuritis. The optic chiasm is unremarkable. The extraocular muscles are normal in morphology and signal intensity. No orbital mass. No abnormal enhancement. SINUSES AND MASTOIDS: Clear. BRAIN: There is a small focal area of enhancement present inferiorly and anteriorly within the pons on the right, but it would not explain a sixth nerve palsy. No acute abnormality. BONES AND SOFT TISSUES: Normal bone marrow signal. No acute soft tissue abnormality. IMPRESSION: 1. No evidence of optic neuritis. 2. Small focal area of enhancement within the right anterior/inferior pons, not explaining a sixth nerve palsy. Electronically signed by: Evalene Coho MD 09/07/2024 06:07 AM EST RP Workstation: [id number]   MR Brain W and Wo Contrast Result Date: 09/07/2024 EXAM: MRI BRAIN WITH AND WITHOUT CONTRAST 09/07/2024 05:29:58 AM TECHNIQUE: Multiplanar multisequence MRI of the head/brain was performed with and without the administration of intravenous contrast (gadobutrol  1 MMOL/ML injection 6 mL). COMPARISON: None available. CLINICAL HISTORY: Diplopia in right eye with cranial nerve 6 palsy, history of MS. FINDINGS: BRAIN AND VENTRICLES: No acute infarct. No acute intracranial hemorrhage. No mass effect or midline shift. No hydrocephalus.  The sella is unremarkable. Normal flow voids. There is moderate periventricular, subcortical and deep cerebral white matter disease. There is also involvement of the pons. None of the lesions demonstrates restricted diffusion or contrast enhancement. There is no abnormal parenchymal or meningeal enhancement. No mass. ORBITS: No acute abnormality. SINUSES: No acute abnormality. BONES AND SOFT TISSUES: Normal bone marrow signal and enhancement. No acute soft tissue abnormality. IMPRESSION: 1. Moderate periventricular, subcortical, and deep cerebral white matter disease, including involvement of the pons, without restricted diffusion or contrast enhancement. The findings are compatible with the clinical diagnosis of multiple sclerosis. There is no evidence of active demyelination. 2. No abnormal parenchymal or meningeal enhancement. Electronically signed by: Evalene Coho MD 09/07/2024 05:55 AM EST RP Workstation: [id number]     Discharge Instructions: Discharge Instructions     Ambulatory Referral to Neuro Rehab   Complete by: As directed    MS exacerbation, CN VI palsy   Ambulatory referral to Neurology   Complete by: As directed    An appointment is requested in approximately: 2 weeks   Call MD for:  difficulty breathing, headache or visual disturbances   Complete by: As directed    Call MD for:  extreme fatigue   Complete by: As directed    Call MD for:  hives   Complete by: As directed    Call MD for:  persistant dizziness or light-headedness   Complete by: As directed    Call MD for:  persistant nausea and vomiting   Complete by: As directed    Call MD for:  redness, tenderness, or signs of infection (pain, swelling, redness, odor or green/yellow discharge around incision site)   Complete by: As directed    Call MD for:  severe uncontrolled pain   Complete by: As directed    Call MD for:  temperature >100.4   Complete by: As directed    Diet general   Complete by: As directed     Discharge instructions   Complete by: As directed    You came to the hospital for double vision and you were diagnosed  with a MS flare.  We treated you with IV steroids.   *For your MS flare -We have started you on these following medications:  -Prednisone  1,250 mg daily: Take 25 tablets with breakfast(must be with food) for three more days. Start this on January 6th 2025  -Protonix : Take one tablet for 7 days  -Zofran : take one tablet every 8 hours as needed for nausea  Follow-up appointments: Please visit your family doctor in 7 to 10 days Please visit your neurologist (brain doctor) in 7-14 days: Dr. Vear for management of MS   If you have any questions or concerns please feel free to call: Internal medicine clinic at [phone]   If you have any of these following symptoms, please call us  or seek care at an emergency department: -Chest Pain -Difficulty Breathing -Worsening vision -Syncope (passing out) -Drooping of face -Slurred speech -Sudden weakness in your leg or arm -Fever -Chills   We are glad that you are feeling better, it was a pleasure to care for you!   Increase activity slowly   Complete by: As directed        Signed: Charmayne Holmes, DO 09/08/2024, 1:44 PM     "

## 2024-09-08 NOTE — Plan of Care (Signed)

## 2024-09-08 NOTE — Discharge Instructions (Signed)
 You came to the hospital for double vision and you were diagnosed with a MS flare.  We treated you with IV steroids.   *For your MS flare -We have started you on these following medications:  -Prednisone  1,250 mg daily: Take 25 tablets with breakfast(must be with food) for three more days. Start this on January 6th 2025  -Protonix : Take one tablet for 7 days  -Zofran : take one tablet every 8 hours as needed for nausea  Follow-up appointments: Please visit your family doctor in 7 to 10 days Please visit your neurologist (brain doctor) in 7-14 days: Dr. Vear for management of MS   If you have any questions or concerns please feel free to call: Internal medicine clinic at [phone]   If you have any of these following symptoms, please call us  or seek care at an emergency department: -Chest Pain -Difficulty Breathing -Worsening vision -Syncope (passing out) -Drooping of face -Slurred speech -Sudden weakness in your leg or arm -Fever -Chills   We are glad that you are feeling better, it was a pleasure to care for you!

## 2024-09-28 NOTE — Progress Notes (Unsigned)
 "  [facility]  PATIENT: Colin Brown DOB: August 17, 1971  REFERRING DOCTOR OR PCP:  *** SOURCE: ***  _________________________________   HISTORICAL  CHIEF COMPLAINT:  No chief complaint on file.   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  He is a 54 year old man with hyperlipidemia who was diagnosed with MS in 2011 after presenting with transverse myelitis and an enhancing focus within the spinal cord adjacent to T11-T12 followed later that year with right optic neuritis.  For both of the episodes he was treated with IV Solu-Medrol  with benefit.  He was initially started on Copaxone but stopped when he was thinking about joining a study involving [employer].  He restarted Copaxone.  He switched to Tysabri in 2015 and took that for 6 months but felt his fatigue and mood issues worsened prompting a return to Copaxone.  At some point he was going to be placed on Gilenya as part of a clinical trial but he was not immune to varicella-zoster so failed the screen.  In 2017 he stopped all disease-modifying therapies and had been clinically and radiographically stable according to the note from [facility] 03/18/2024.   Imaging: MRI of the brain 09/07/2024 showed multiple T2/FLAIR hyperintense foci in the cerebral hemispheres, predominantly in the subcortical and deep white matter.  No classic radially oriented periventricular in the corpus callosum.  There are T2 hyperintense foci in the pons.  No enhancing lesions.  MRI of the orbits 09/07/2024 was normal..  The report mentions a small focal area of enhancement in the right anterior inferior pons.  However, this is not seen on any of the 3 planes of imaging on the accompanying MRI of the head also performed today and is most likely artifact  MRI  reports 03/26/2024 documented that the MRI of the brain was unchanged compared to the MRI from 10/08/2021.  MRI report cervical spine 06/29/2015 showed a normal spinal cord.  MRI report 03/22/2010 showed subtle inversion recovery signal abnormality in the right optic nerve with wispy enhancement along the nerve sheath is nonspecific but consistent with optic neuritis  Laboratory: In the 02/21/2010 note on [facility] labs are reported In terms of other workup, he had a spinal tap, which was normal. There was no pleocytosis protein was normal. MS profile was normal. He had a normal IgG index and normal synthesis rate, and there were no bands. Other lab work included HIV, anticardiolipin antibodies, Sjogren's antibodies, rheumatoid factor, ACE level, ANA, and these were negative.   REVIEW OF SYSTEMS: Constitutional: No fevers, chills, sweats, or change in appetite Eyes: No visual changes, double vision, eye pain Ear, nose and throat: No hearing loss, ear pain, nasal congestion, sore throat Cardiovascular: No chest pain, palpitations Respiratory:  No shortness of breath at rest or with exertion.   No wheezes GastrointestinaI: No nausea, vomiting, diarrhea, abdominal pain, fecal incontinence Genitourinary:  No dysuria, urinary retention or frequency.  No nocturia. Musculoskeletal:  No neck pain, back pain Integumentary: No rash, pruritus, skin lesions Neurological: as above Psychiatric: No depression at this time.  No anxiety Endocrine: No palpitations, diaphoresis, change in appetite, change in weigh or increased thirst Hematologic/Lymphatic:  No anemia, purpura, petechiae. Allergic/Immunologic: No itchy/runny eyes, nasal congestion, recent allergic reactions, rashes  ALLERGIES: Allergies[1]  HOME MEDICATIONS: Current Medications[2]  PAST MEDICAL HISTORY: Past Medical History:  Diagnosis Date   Depression    Multiple sclerosis    Neuromuscular disorder (HCC)     PAST SURGICAL  HISTORY: No past surgical history on file.  FAMILY HISTORY: Family History  Problem Relation Age of Onset   Hypertension Mother     SOCIAL HISTORY: Social History   Socioeconomic History   Marital status: Single    Spouse name: Not on file   Number of children: Not on file   Years of education: Not on file   Highest education level: Not on file  Occupational History   Not on file  Tobacco Use   Smoking status: Never   Smokeless tobacco: Not on file  Substance and Sexual Activity   Alcohol use: Yes    Comment: occasional   Drug use: No   Sexual activity: Yes    Birth control/protection: Pill  Other Topics Concern   Not on file  Social History Narrative   Not on file   Social Drivers of Health   Tobacco Use: Unknown (09/08/2024)   Patient History    Smoking Tobacco Use: Never    Smokeless Tobacco Use: Unknown    Passive Exposure: Not on file  Financial Resource Strain: Medium Risk (09/17/2023)   Received from [facility]   Overall Financial Resource Strain (CARDIA)    Difficulty of Paying Living Expenses: Somewhat hard  Food Insecurity: Food Insecurity Present (09/07/2024)   Epic    Worried About Programme Researcher, Broadcasting/film/video in the Last Year: Sometimes true    Ran Out of Food in the Last Year: Never true  Transportation Needs: No Transportation Needs (09/07/2024)   Epic    Lack of Transportation (Medical): No    Lack of Transportation (Non-Medical): No  Physical Activity: Insufficiently Active (09/17/2023)   Received from [facility]   Exercise Vital Sign    On average, how many days per week do you engage in moderate to strenuous exercise (like a brisk walk)?: 1 day    On average, how many minutes do you engage in exercise at this level?: 10 min  Stress: Stress Concern Present (12/25/2022)   Received from [facility] - Occupational Stress Questionnaire    Feeling of Stress : Very much  Social Connections: Moderately  Integrated (09/07/2024)   Social Connection and Isolation Panel    Frequency of Communication with Friends and Family: More than three times a week    Frequency of Social Gatherings with Friends and Family: Twice a week    Attends Religious Services: Never    Database Administrator or Organizations: Yes    Attends Engineer, Structural: More than 4 times per year    Marital Status: Living with partner  Intimate Partner Violence: Not At Risk (09/07/2024)   Epic    Fear of Current or Ex-Partner: No    Emotionally Abused: No    Physically Abused: No    Sexually Abused: No  Depression (PHQ2-9): Not on file  Alcohol Screen: Not on file  Housing: Low Risk (09/07/2024)   Epic    Unable to Pay for Housing in the Last Year: No    Number of Times Moved in the Last Year: 0    Homeless in the Last Year: No  Utilities: Not At Risk (09/07/2024)   Epic    Threatened with loss of utilities: No  Health Literacy: Not on file       PHYSICAL EXAM  There were no  vitals filed for this visit.  There is no height or weight on file to calculate BMI.   General: The patient is well-developed and well-nourished and in no acute distress  HEENT:  Head is [place]/AT.  Sclera are anicteric.  Funduscopic exam shows normal optic discs and retinal vessels.  Neck: No carotid bruits are noted.  The neck is nontender.  Cardiovascular: The heart has a regular rate and rhythm with a normal S1 and S2. There were no murmurs, gallops or rubs.    Skin: Extremities are without rash or  edema.  Musculoskeletal:  Back is nontender  Neurologic Exam  Mental status: The patient is alert and oriented x 3 at the time of the examination. The patient has apparent normal recent and remote memory, with an apparently normal attention span and concentration ability.   Speech is normal.  Cranial nerves: Extraocular movements are full. Pupils are equal, round, and reactive to light and accomodation.  Visual fields are full.   Facial symmetry is present. There is good facial sensation to soft touch bilaterally.Facial strength is normal.  Trapezius and sternocleidomastoid strength is normal. No dysarthria is noted.  The tongue is midline, and the patient has symmetric elevation of the soft palate. No obvious hearing deficits are noted.  Motor:  Muscle bulk is normal.   Tone is normal. Strength is  5 / 5 in all 4 extremities.   Sensory: Sensory testing is intact to pinprick, soft touch and vibration sensation in all 4 extremities.  Coordination: Cerebellar testing reveals good finger-nose-finger and heel-to-shin bilaterally.  Gait and station: Station is normal.   Gait is normal. Tandem gait is normal. Romberg is negative.   Reflexes: Deep tendon reflexes are symmetric and normal bilaterally.   Plantar responses are flexor.    DIAGNOSTIC DATA (LABS, IMAGING, TESTING) - I reviewed patient records, labs, notes, testing and imaging myself where available.  Lab Results  Component Value Date   WBC 13.1 (H) 09/08/2024   HGB 14.4 09/08/2024   HCT 41.1 09/08/2024   MCV 87.8 09/08/2024   PLT 325 09/08/2024      Component Value Date/Time   NA 136 09/08/2024 0528   K 4.4 09/08/2024 0528   CL 102 09/08/2024 0528   CO2 21 (L) 09/08/2024 0528   GLUCOSE 142 (H) 09/08/2024 0528   BUN 25 (H) 09/08/2024 0528   CREATININE 1.31 (H) 09/08/2024 0528   CALCIUM 9.2 09/08/2024 0528   GFRNONAA >60 09/08/2024 0528   GFRAA >60 06/30/2017 2218   No results found for: CHOL, HDL, LDLCALC, LDLDIRECT, TRIG, CHOLHDL No results found for: YHAJ8R Lab Results  Component Value Date   VITAMINB12 1,165 (H) 09/08/2024   No results found for: TSH     ASSESSMENT AND PLAN  ***   Oretha Weismann A. Vear, MD, [facility] 09/28/2024, 2:47 PM Certified in Neurology, Clinical Neurophysiology, Sleep Medicine and Neuroimaging  [facility] [address], [address] [place], [place] [address] [phone]     [1]  Allergies Allergen Reactions   Hydromorphone Other (See Comments) and Tinitus    Ear ringing  [2]  Current Outpatient Medications:    baclofen  (LIORESAL ) 20 MG tablet, Take 20 mg by mouth daily as needed for muscle spasms., Disp: , Rfl:    Cholecalciferol  (VITAMIN D3 SUPER STRENGTH) 50 MCG (2000 UT) TABS, Take 1 tablet by mouth daily., Disp: , Rfl:    [Paused] cyanocobalamin 2000 MCG tablet, Take 2,000 mcg by mouth daily., Disp: , Rfl:  gabapentin  (NEURONTIN ) 100 MG capsule, Take 200 mg by mouth daily as needed (pain)., Disp: , Rfl:    naproxen sodium (ALEVE) 220 MG tablet, Take 220-440 mg by mouth daily as needed (pain)., Disp: , Rfl:    ondansetron  (ZOFRAN -ODT) 4 MG disintegrating tablet, Take 1 tablet (4 mg total) by mouth every 8 (eight) hours as needed for nausea or vomiting., Disp: 20 tablet, Rfl: 0   pantoprazole  (PROTONIX ) 40 MG tablet, Take 1 tablet (40 mg total) by mouth daily., Disp: 7 tablet, Rfl: 0   predniSONE  (DELTASONE ) 50 MG tablet, Take 25 tablets (1,250 mg total) by mouth daily. Take with breakfast!, Disp: 75 tablet, Rfl: 0  "

## 2024-09-29 ENCOUNTER — Ambulatory Visit: Payer: Self-pay | Admitting: Neurology

## 2024-09-29 NOTE — Therapy (Signed)
 " OUTPATIENT PHYSICAL THERAPY NEURO EVALUATION   Patient Name: Colin Brown MRN: [id number] DOB:[birth year], 54 y.o., male Today's Date: 10/01/2024   PCP: Valma Lannie LABOR, PA-C REFERRING PROVIDER: Rosan Dayton BROCKS, DO  END OF SESSION:  PT End of Session - 09/30/24 1452     Visit Number 1    Number of Visits 13   12 visits plus Eval   Date for Recertification  11/25/24   for scheduling delays   Authorization Type Medicare A&B    Progress Note Due on Visit 10    PT Start Time 1452   Pt received after OT evaluation   PT Stop Time 1546    PT Time Calculation (min) 54 min    Equipment Utilized During Treatment Gait belt    Activity Tolerance Patient tolerated treatment well    Behavior During Therapy WFL for tasks assessed/performed          Past Medical History:  Diagnosis Date   Depression    Multiple sclerosis    Neuromuscular disorder (HCC)    History reviewed. No pertinent surgical history. Patient Active Problem List   Diagnosis Date Noted   Multiple sclerosis exacerbation 09/07/2024   Vitamin D  deficiency 12/25/2022   Essential hypertension 07/05/2021   Fatigue 07/05/2021   Hyperlipidemia 07/05/2021   Seasonal allergies 12/30/2019   Episode of recurrent major depressive disorder 09/20/2017   History of optic neuritis 04/30/2012   Anxiety 03/27/2012   Multiple sclerosis 09/27/2011    ONSET DATE: 09/08/2024 (date of referral)  REFERRING DIAG: G35.D (ICD-10-CM) - Multiple sclerosis exacerbation  THERAPY DIAG:  Other abnormalities of gait and mobility  Muscle weakness (generalized)  Other symptoms and signs involving the nervous system  Other symptoms and signs involving the musculoskeletal system  Rationale for Evaluation and Treatment: Rehabilitation  SUBJECTIVE:                                                                                                                                                                                              SUBJECTIVE STATEMENT: Pt reports having onset of double vision with R eye just prior to the new year and was hospitalized beginning of January 2026.  Pt reports symptoms d/t MS.  Pt reports weakness in B LE's, pins and needles below knees to feet (chronic), and fatigue.  Doesn't have to use cane anymore (d/t strength improving)-- stopped using 2014-2015 (diagnosed with MS in 2011).  Depth perception and dizziness issues d/t eye impairments.  Pt reports not using eye patch at home. Pt accompanied by: self  PERTINENT HISTORY: Pt presented to hospital 09/07/24 with diplopia x1 week,  progressive worsening pain R eye (pt reports similar to previous optic neuritis episodes), HA, and unable to track R eye past midline.  Pt admitted with MS exacerbation, R CN VI palsy, and Vitamin D  Deficiency.  Pt noticed some cognitive slowing past week (slow to find words and process); difficulty ambulating (stubbed toe) but no issues when wearing eye patch.  OT evaluation during hospitalization recommended OP OT for vision assessment and PT recommended SPC use with follow-up with OP neuro rehab.  PMH includes MS, peripheral neuropathy.  Last MS flare 2011.  PAIN:  Are you having pain? Yes: NPRS scale: 6-7/10 Pain location: B knees down to feet; chronic Pain description: pins and needles; numbness Aggravating factors: exertion; walking too much Relieving factors: medications  PRECAUTIONS: Fall; Diplopia R eye  RED FLAGS: None   WEIGHT BEARING RESTRICTIONS: No  FALLS: Has patient fallen in last 6 months? No  LIVING ENVIRONMENT: Lives with:  Lives with his teenage daughter (3 y.o.) and dogs but spends time also at significant other's home. Lives in: House/apartment; main floor of house Stairs: Yes: External: 1-2 steps; has places to use hands for support Has following equipment at home: Single point cane  PLOF: Independent; (+) driving. (-) Working.  PATIENT GOALS: Hoping to find ways to adjust to new new  d/t changes with eye.  OBJECTIVE:  Note: Objective measures were completed at Evaluation unless otherwise noted.  DIAGNOSTIC FINDINGS:  MRI Brain 09/07/24: 1. Moderate periventricular, subcortical, and deep cerebral white matter disease, including involvement of the pons, without restricted diffusion or contrast enhancement. The findings are compatible with the clinical diagnosis of multiple sclerosis. There is no evidence of active demyelination. 2. No abnormal parenchymal or meningeal enhancement.  MR ORBITS 09/07/24: 1. No evidence of optic neuritis. 2. Small focal area of enhancement within the right anterior/inferior pons, not explaining a sixth nerve palsy.  COGNITION: Overall cognitive status: Within functional limits for tasks assessed   SENSATION: Decreased light touch sensation below B knees; L lower leg feels different to light touch compared to R; L foot with increased sensitivity compared to R  COORDINATION: Intact B LE heel to shin in sitting; good alternating toe tapping in sitting  EDEMA:  None noted B LE's  MUSCLE TONE: Intact tone B LE's  POSTURE: rounded shoulders and forward head   LOWER EXTREMITY MMT:    MMT Right Eval Left Eval  Hip flexion 5/5 4-/5  Hip extension    Hip abduction    Hip adduction    Hip internal rotation    Hip external rotation    Knee flexion 5/5 4+/5  Knee extension 5/5 4-/5  Ankle dorsiflexion 5/5 3+/5  Ankle plantarflexion At least 3/5 AROM <3/5  Ankle inversion    Ankle eversion    (Blank rows = not tested)  BED MOBILITY:  Pt reports needing to take it slow d/t vision (will get dizzy) but no physical assist required.  TRANSFERS: Sit to stand: Modified independence  Assistive device utilized: None     Stand to sit: Modified independence  Assistive device utilized: None      STAIRS: Findings: Level of Assistance: SBA, Stair Negotiation Technique: Alternating Pattern  with Single Rail on Right, Number of Stairs: 4,  Height of Stairs: 6 inch   , and Comments: steady on stairs GAIT: Findings: Gait Characteristics: B LE ER; decreased R>L UE arm swing; decreased B LE step length, Distance walked: clinic distances, Assistive device utilized:None, Level of assistance: Complete Independence, and Comments:  pt reporting feeling like L leg was stiff; did not wear eye patch (saw double vision); occasional decreased L foot clearance  FUNCTIONAL TESTS:  5 times sit to stand: 24.78 seconds (no UE support) 10 meter walk test: 0.79 m/sec (12.53 seconds) no AD use and no eye patch use FGA: 12/30 FUNCTIONAL GAIT ASSESSMENT  Date: 09/30/24 (no eye patch utilized d/t pt not using eye patch within home) Score  GAIT LEVEL SURFACE Instructions: Walk at your normal speed from here to the next mark (6 m) [20 ft]. (2) Mild impairment - Walks 6 m (20 ft) in less than 7 seconds but greater than 5.5 seconds, uses assistive device, slower speed, mild gait deviations, or deviates 15.24 -25.4 cm (6 -10 in) outside of the 30.48-cm (12-in) walkway width.6.91 seconds  2.   CHANGE IN GAIT SPEED Instructions: Begin walking at your normal pace (for 1.5 m [5 ft]). When I tell you go, walk as fast as you can (for 1.5 m [5 ft]). When I tell you slow, walk as slowly as you can (for 1.5 m [5 ft]. (2) Mild impairment - Is able to change speed but demonstrates mild gait deviations, deviates 15.24 -25.4 cm (6 -10 in) outside of the 30.48-cm (12-in) walkway width, or no gait deviations but unable to achieve a significant change in velocity, or uses an assistive device  3.    GAIT WITH HORIZONTAL HEAD TURNS Instructions: Walk from here to the next mark 6 m (20 ft) away. Begin walking at your normal pace. Keep walking straight; after 3 steps, turn your head to the right and keep walking straight while looking to the right. After 3 more steps, turn your head to the left and keep walking straight while looking left. Continue alternating looking right and  left. (1) Moderate impairment - Performs head turns with moderate change in gait velocity, slows down, deviates 25.4 -38.1 cm (10 -15 in) outside 30.48-cm (12-in) walkway width but recovers, can continue to walk.  4.   GAIT WITH VERTICAL HEAD TURNS Instructions: Walk from here to the next mark (6 m [20 ft]). Begin walking at your normal pace. Keep walking straight; after 3 steps, tip your head up and keep walking straight while looking up. After 3 more steps, tip your head down, keep walking straight while looking down. Continue  alternating looking up and down every 3 steps until you have completed 2 repetitions in each direction. (1) Moderate impairment - Performs task with moderate change in gait velocity, slows down, deviates 25.4 -38.1 cm (10 -15 in) outside 30.48-cm (12-in) walkway width but recovers, can continue to walk.  5.  GAIT AND PIVOT TURN Instructions: Begin with walking at your normal pace. When I tell you, turn and stop, turn as quickly as you can to face the opposite direction and stop. (3) Normal - Pivot turns safely within 3 seconds and stops quickly with no loss of balance  6.   STEP OVER OBSTACLE Instructions: Begin walking at your normal speed. When you come to the shoe box, step over it, not around it, and keep walking. (1) Moderate impairment - Is able to step over one shoe box (11.43 cm [4.5 in] total height) but must slow down and adjust steps to clear box safely. May require verbal cueing.  7.   GAIT WITH NARROW BASE OF SUPPORT Instructions: Walk on the floor with arms folded across the chest, feet aligned heel to toe in tandem for a distance of 3.6 m [12 ft].  The number of steps taken in a straight line are counted for a maximum of 10 steps. (0) Severe impairment - Ambulates less than 4 steps heel to toe or cannot perform without assistance. CGA to min assist for balance  8.   GAIT WITH EYES CLOSED Instructions: Walk at your normal speed from here to the next mark (6 m [20  ft]) with your eyes closed. (0) Severe impairment - Cannot walk 6 m (20 ft) without assistance, severe gait deviations or imbalance, deviates greater than 38.1 cm (15 in) outside 30.48-cm (12-in) walkway width or will not attempt task. Significant veer to L and requiring assist to prevent fall.  9.   AMBULATING BACKWARDS Instructions: Walk backwards until I tell you to stop (0) Severe impairment - Cannot walk 6 m (20 ft) without assistance, severe gait deviations or imbalance, deviates greater than 38.1 cm (15 in) outside 30.48-cm (12-in) walkway width or will not attempt task.  Assist for balance to prevent fall after a few feet.  10. STEPS Instructions: Walk up these stairs as you would at home (ie, using the rail if necessary). At the top turn around and walk down. (2) Mild impairment-Alternating feet, must use rail.  Total 12/30   Interpretation of scores: Non-Specific Older Adults Cutoff Score: <=22/30 = risk of falls Parkinsons Disease Cutoff score <15/30= fall risk (Hoehn & Yahr 1-4)  Minimally Clinically Important Difference (MCID)  Stroke (acute, subacute, and chronic) = MDC: 4.2 points Vestibular (acute) = MDC: 6 points Community Dwelling Older Adults =  MCID: 4 points Parkinsons Disease  =  MDC: 4.3 points  (Academy of Neu[employer]ic [employer] Therapy (nd). Functional Gait Assessment. Retrieved from https://www.neuropt.org/docs/default-source/cpgs/core-outcome-measures/function-gait-assessment-pocket-guide-proof9-(2).pdf?sfvrsn=b36f35043_0.)   PATIENT SURVEYS:  TBA  VITALS: Vitals:   09/30/24 1507  BP: 129/88  Pulse: 82                                                                                                                               TREATMENT DATE: 09/30/24    PATIENT EDUCATION: Education details: Eval results; POC. Person educated: Patient Education method: Explanation and Verbal cues Education comprehension: verbalized understanding, verbal cues required, and  needs further education  HOME EXERCISE PROGRAM: TBA  GOALS: Goals reviewed with patient? Yes  SHORT TERM GOALS: Target date: 10/28/2024  Pt will be 50% compliant or greater with initial HEP in order to improve strength and balance in order to decrease fall risk and improve function at home for ADL's. Baseline:  TBA Goal status: INITIAL  2.  Pt will decrease 5 Time Sit to Stand by at least 2 seconds in order to demonstrate improvement in LE strength. Baseline: 24.78 seconds Goal status: INITIAL  3.  Pt will demonstrate improved visual strategies in order to demonstrate increased safety with functional mobility and decreased fall risk. Baseline: visual impairments (diplopia R eye) with varying use of eye patch Goal status: INITIAL   [place] TERM GOALS: Target date: 11/11/2024  Pt  will be 75% or greater compliant with final HEP in order to improve strength and balance in order to decrease fall risk and improve function at home for ADL's. Baseline: TBA Goal status: INITIAL  2.  Pt will decrease 5 Time Sit to Stand by at least 3 seconds in order to demonstrate clinically significant improvement in LE strength. Baseline: 24.78 seconds Goal status: INITIAL  3.  Pt will increase by at least 0.13 m/s in order to demonstrate clinically significant improvement in community ambulation.  Baseline: 0.79 m/sec Goal status: INITIAL  4.  Patient will increase Functional Gait Assessment score by 4 points or greater as to reduce fall risk and improve dynamic gait safety with community ambulation. Baseline: 12/30 Goal status: INITIAL  ASSESSMENT:  CLINICAL IMPRESSION: Patient is a 54 y.o. male who was seen today for physical therapy evaluation and treatment for MS exacerbation.  Patient presents with L LE weakness, visual impairments (diplopia R eye), and balance impairments.  Pt has been walking around home without eye patch but uses eye patch when outside of home/in community. These  impairments are limiting patient from ADL's and community activities.  Evaluation included the following assessment tools: 5 time sit to stand, , and FGA.  Pt scored 24.78 seconds on the 5 time sit to stand test indicating pt is at increased risk of falls (>15 seconds = increased risk of falls).  Pt scored 0.79 m/sec on the 10 Meter Walk Test indicating pt is a limited community ambulator (Cut off scores: <0.4 m/s = household Ambulator, 0.4-0.8 m/s = limited community Ambulator, >0.8 m/s = community Ambulator, >1.2 m/s = crossing a street, <1.0 = increased fall risk).  Pt scored 12/30 on FGA indicating pt is at increased risk of falls (</= 22/30 = risk of falls in elderly).   Patient will benefit from skilled PT to address noted impairments, improve overall function, and progress towards [place] term goals.  OBJECTIVE IMPAIRMENTS: Abnormal gait, decreased balance, decreased knowledge of condition, decreased knowledge of use of DME, decreased mobility, difficulty walking, decreased strength, decreased safety awareness, dizziness, impaired sensation, impaired vision/preception, postural dysfunction, and pain.   ACTIVITY LIMITATIONS: stairs, transfers, bed mobility, bathing, locomotion level, and caring for others  PARTICIPATION LIMITATIONS: driving, shopping, community activity, and occupation  PERSONAL FACTORS: Past/current experiences and 1-2 comorbidities: MS; peripheral neuropathy are also affecting patient's functional outcome.   REHAB POTENTIAL: Good  CLINICAL DECISION MAKING: Evolving/moderate complexity  EVALUATION COMPLEXITY: Moderate  PLAN:  PT FREQUENCY: 1-2x/week  PT DURATION: 6 weeks  PLANNED INTERVENTIONS: 97164- PT Re-evaluation, 97750- Physical Performance Testing, 97110-Therapeutic exercises, 97530- Therapeutic activity, [id number]- Neuromuscular re-education, 97535- Self Care, [id number]- Manual therapy, [id number]- Gait training, [phone]- Orthotic Initial, [phone]- Orthotic/Prosthetic  subsequent, [phone]- Aquatic Therapy, [phone]- Electrical stimulation (manual), Patient/Family education, Balance training, Stair training, Taping, Joint mobilization, Spinal mobilization, Vestibular training, Visual/preceptual remediation/compensation, DME instructions, Cryotherapy, Moist heat, and Biofeedback  PLAN FOR NEXT SESSION: Issue HEP.  Balance.  LE strengthening.  Visual strategies to improve balance.   Damien Caulk, PT 10/01/2024, 8:14 AM        "

## 2024-09-30 ENCOUNTER — Other Ambulatory Visit: Payer: Self-pay

## 2024-09-30 ENCOUNTER — Encounter: Payer: Self-pay | Admitting: Occupational Therapy

## 2024-09-30 ENCOUNTER — Ambulatory Visit: Attending: Internal Medicine | Admitting: Occupational Therapy

## 2024-09-30 ENCOUNTER — Ambulatory Visit: Admitting: Physical Therapy

## 2024-09-30 ENCOUNTER — Encounter: Payer: Self-pay | Admitting: Physical Therapy

## 2024-09-30 VITALS — BP 129/88 | HR 82

## 2024-09-30 DIAGNOSIS — R29898 Other symptoms and signs involving the musculoskeletal system: Secondary | ICD-10-CM | POA: Insufficient documentation

## 2024-09-30 DIAGNOSIS — R2689 Other abnormalities of gait and mobility: Secondary | ICD-10-CM | POA: Diagnosis present

## 2024-09-30 DIAGNOSIS — M6281 Muscle weakness (generalized): Secondary | ICD-10-CM

## 2024-09-30 DIAGNOSIS — R29818 Other symptoms and signs involving the nervous system: Secondary | ICD-10-CM | POA: Insufficient documentation

## 2024-09-30 DIAGNOSIS — R41842 Visuospatial deficit: Secondary | ICD-10-CM | POA: Insufficient documentation

## 2024-09-30 NOTE — Therapy (Signed)
 " OUTPATIENT OCCUPATIONAL THERAPY NEURO EVALUATION  Patient Name: Colin Brown MRN: [id number] DOB:Aug 10, 1971, 54 y.o., male Today's Date: 09/30/2024  PCP: Valma Lannie LABOR, PA-C  REFERRING PROVIDER: Rosan Dayton BROCKS, DO  END OF SESSION:  OT End of Session - 09/30/24 1353     Visit Number 1    Number of Visits 13    Date for Recertification  11/21/24    Authorization Type Medicare    OT Start Time 1404    OT Stop Time 1446    OT Time Calculation (min) 42 min    Activity Tolerance Patient tolerated treatment well    Behavior During Therapy WFL for tasks assessed/performed          Past Medical History:  Diagnosis Date   Depression    Multiple sclerosis    Neuromuscular disorder (HCC)    History reviewed. No pertinent surgical history. Patient Active Problem List   Diagnosis Date Noted   Multiple sclerosis exacerbation 09/07/2024   Vitamin D  deficiency 12/25/2022   Essential hypertension 07/05/2021   Fatigue 07/05/2021   Hyperlipidemia 07/05/2021   Seasonal allergies 12/30/2019   Episode of recurrent major depressive disorder 09/20/2017   History of optic neuritis 04/30/2012   Anxiety 03/27/2012   Multiple sclerosis 09/27/2011    ONSET DATE: 09/30/2024 (Date of referral) - dx with MS since 2011  REFERRING DIAG:  MS Exacerbation  THERAPY DIAG:  Visuospatial deficit  Muscle weakness (generalized)  Other symptoms and signs involving the musculoskeletal system  Other symptoms and signs involving the nervous system  Rationale for Evaluation and Treatment: Rehabilitation  SUBJECTIVE:   SUBJECTIVE STATEMENT: He missed his neurology appointment yesterday due to weather/clinic was closed. He was told he is okay to drive. Symptoms also exacerbated by stress.   Pt accompanied by: self  PERTINENT HISTORY: PMH: depression, MS, anxiety, HTN, optic neuritis, and HLD.   Colin Brown is a 54 y.o. male with history of MS not currently on disease modifying therapy with  no flare since 2011 who presented with a 1 week history of horizontal diplopia, right nerve palsy, headache and right eye pain. Patient reported that headache is resolved and pressure behind his right eye is only intermittent now. Diplopia is still present but is slightly improving, and improved range of motion of the right eye is noted on exam  PRECAUTIONS: Fall and Other: pt has diplopia with impaired depth perception  WEIGHT BEARING RESTRICTIONS: No  PAIN:  Are you having pain? Yes: NPRS scale: 6/10 Pain location: headache Pain description: seems to be due to his vision Aggravating factors: bright lights; stress Relieving factors: rest  FALLS: Has patient fallen in last 6 months? No  LIVING ENVIRONMENT: Living Arrangements: daughter Type of Home: House Home Access: Stairs to enter [employer] of Steps: 3 Entrance Stairs-Rails: None Home Layout: Two level;Able to live on main level with bedroom/bathroom;Full bath on main level Alternate Level Stairs-Number of Steps: 13+ Alternate Level Stairs-Rails: Right Bathroom Shower/Tub: Tub/shower unit;Walk-in shower Bathroom Toilet: Standard Bathroom Accessibility: No Home Equipment: Cane - single point Additional Comments: pt plans on getting grab bars in shower    PRIOR LEVEL OF FUNCTION : Independent/Modified Independent;Driving; on disability; art (collage and painting) Mobility Comments: Ind ADLs Comments: Ind  PATIENT GOALS: To resolve diplopia and manage MS  OBJECTIVE:  Note: Objective measures were completed at Evaluation unless otherwise noted.  HAND DOMINANCE: Right  ADLs: Overall ADLs: able to see things up close without diplopia; gen mod I  IADLs:  Handwriting: no change  MOBILITY STATUS: Independent  ACTIVITY TOLERANCE: Activity tolerance: fair  FUNCTIONAL OUTCOME MEASURES: PSFS:    Each score is rated by the patient on a scale from 0 to 10, in increments of 1. The higher the score, the less  difficulty the patient encounters when performing the activity. Activities that rate closer to 10 are likely to be very little impaired and are performed in a similar manner as they used to be before the injury occurred. Average results closer to 0 indicate an increased loss of functionality due to the debilitating condition.  Total score = sum of the activity scores/number of activities Minimum detectable change (90%CI) for average score = 2 points Minimum detectable change (90%CI) for single activity score = 3 points   UPPER EXTREMITY ROM:    BUE: WNL  UPPER EXTREMITY MMT:      RUE: WFL (poor endurance)  LUE: BFL  HAND FUNCTION: Grip strength: Right: 72.5 lbs; Left: 52 lbs - decline since 2015  COORDINATION: Finger Nose Finger test: Impaired on L side 9 Hole Peg test: Right: 23 sec; Left: 29 sec  SENSATION: No acute changes  EDEMA: none reported or observed  MUSCLE TONE: WFL  COGNITION: Overall cognitive status: Within functional limits for tasks assessed  VISION: Subjective report: Diplopia since hospitalization Baseline vision: Wears glasses all the time; Distance correction at baseline; uses OTC readers PRN Visual history: optic neuritis OD  VISION ASSESSMENT: Impaired (+) diplopia - visual acuity distance and near [facility] with glasses  PERCEPTION: Impaired: depth perception  PRAXIS: WFL  OBSERVATIONS: Pt ambulates without use of AD. No loss of balance. The pt appears well kept and has glasses donned. Eye patch is observed on top of hat.                                                                                                                            TREATMENT :    OT educated pt on rehabilitation process and results of objective measures in relation to pt specific goals.   PATIENT EDUCATION: Education details: OT Role and POC Person educated: Patient Education method: Explanation Education comprehension: verbalized understanding  HOME EXERCISE  PROGRAM: N/A for this visit  GOALS:  SHORT TERM GOALS: Target date: 10/28/2024    Patient will demonstrate initial BUE HEP with 25% verbal cues or less for proper execution. Baseline: Goal status: INITIAL  2.  Patient will independently recall at least 3 energy conservation principles in relation to ADLs to increase functional independence.  Baseline:  Goal status: INITIAL  3.  Patient will independently recall at least 2 compensatory strategies for visual impairment without cueing. Baseline:  Goal status: INITIAL  4.  Pt will be independent with diplopia HEP in efforts to improve safety with functional mobility. Baseline:  Goal status: INITIAL  [place] TERM GOALS: Target date: 11/21/24  Patient will demonstrate updated BUE HEP with visual handouts only for proper execution. Baseline:  Goal status: INITIAL  2.  Patient will report at least two-point increase in average PSFS score or at least three-point increase in a single activity score indicating functionally significant improvement given minimum detectable change.  Baseline: 6.0 total score (See above for individual activity scores)  Goal status: INITIAL  3.  Patient will demonstrate at least 60 lbs L grip strength as needed to open jars and other containers. Baseline: Right: 72.5 lbs; Left: 52 lbs Goal status: INITIAL  ASSESSMENT:  CLINICAL IMPRESSION: Patient is a 54 y.o. male who was seen today for occupational therapy evaluation for MS. Hx includes depression, MS, anxiety, HTN, optic neuritis, and HLD. Patient currently presents below baseline level of functioning demonstrating functional deficits and impairments as noted below. Pt would benefit from skilled OT services in the outpatient setting to work on impairments as noted below to help pt return to PLOF as able.    PERFORMANCE DEFICITS: in functional skills including ADLs, IADLs, coordination, dexterity, tone, strength (L weaker than R), Fine motor control, Gross  motor control, mobility, balance, endurance, decreased knowledge of precautions, decreased knowledge of use of DME, vision, and UE functional use.   IMPAIRMENTS: are limiting patient from ADLs, IADLs, and leisure.   CO-MORBIDITIES: may have co-morbidities  that affects occupational performance. Patient will benefit from skilled OT to address above impairments and improve overall function.  MODIFICATION OR ASSISTANCE TO COMPLETE EVALUATION: Min-Moderate modification of tasks or assist with assess necessary to complete an evaluation.  OT OCCUPATIONAL PROFILE AND HISTORY: Detailed assessment: Review of records and additional review of physical, cognitive, psychosocial history related to current functional performance.  CLINICAL DECISION MAKING: Moderate - several treatment options, min-mod task modification necessary  REHAB POTENTIAL: Good  EVALUATION COMPLEXITY: Moderate    PLAN:  OT FREQUENCY: 1-2x/week  OT DURATION: 6 weeks  PLANNED INTERVENTIONS: 97168 OT Re-evaluation, 97535 self care/ADL training, 02889 therapeutic exercise, 97530 therapeutic activity, 97112 neuromuscular re-education, 97750 Physical Performance Testing, functional mobility training, visual/perceptual remediation/compensation, energy conservation, coping strategies training, patient/family education, and DME and/or AE instructions  RECOMMENDED OTHER SERVICES: N/A for this visit  CONSULTED AND AGREED WITH PLAN OF CARE: Patient  PLAN FOR NEXT SESSION: diplopia HEP   Jocelyn CHRISTELLA Bottom, OT 09/30/2024, 4:02 PM           "

## 2024-10-09 NOTE — Progress Notes (Unsigned)
 "  [facility]  PATIENT: Colin Brown DOB: 11/29/1970  REFERRING DOCTOR OR PCP:  Lannie Gell, PA-C; Aisha Seals, MD SOURCE: Patient, notes from recent hospitalization, imaging and lab reports, MRI images personally reviewed.  _________________________________   HISTORICAL  CHIEF COMPLAINT:  No chief complaint on file.   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  ***Most foci have an appearance more typical for chronic microvascular ischemic change  He is a 54 year old man with hyperlipidemia who was diagnosed with MS in 2011 after presenting with transverse myelitis and an enhancing focus within the spinal cord adjacent to T11-T12 followed later that year with right optic neuritis.  For both of the episodes he was treated with IV Solu-Medrol  with benefit.  He was initially started on Copaxone but stopped when he was thinking about joining a study involving [employer].  He restarted Copaxone.  He switched to Tysabri in 2015 and took that for 6 months but felt his fatigue and mood issues worsened prompting a return to Copaxone.  At some point he was going to be placed on Gilenya as part of a clinical trial but he was not immune to varicella-zoster so failed the screen.  In 2017 he stopped all disease-modifying therapies and had been clinically and radiographically stable according to the note from [facility] 03/18/2024.  He presented to the [facility] emergency room 09/06/2024 with diplopia and headache.  Brain showed changes consistent with MS.  There were no definite and had seen lesion.  He received 1 day of IV Solu-Medrol  and was discharged high-dose oral steroids for 3 additional days.  Symptoms had improved while in the hospital.  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   Imaging: MRI of the brain 09/07/2024 showed multiple T2/FLAIR hyperintense foci in the cerebral hemispheres, predominantly in the subcortical and deep white matter.  No classic radially oriented periventricular in the corpus callosum.  There are T2 hyperintense foci in the pons.  No enhancing lesions.  MRI of the orbits 09/07/2024 was normal..  The report mentions a small focal area of enhancement in the right anterior inferior pons.  However, this is not seen on any of the 3 planes of imaging on the accompanying MRI of the head also performed today and is most likely artifact  MRI reports 03/26/2024 documented that the MRI of the brain was unchanged compared to the MRI from 10/08/2021.  MRI report cervical spine 06/29/2015 showed a normal spinal cord.  MRI report 03/22/2010 showed subtle inversion recovery signal abnormality in the right optic nerve with wispy enhancement along the nerve sheath is nonspecific but consistent with optic neuritis  Laboratory: In the 02/21/2010 note on [facility] labs are reported In terms of other workup, he had a spinal tap, which was normal. There was no pleocytosis protein was normal. MS profile was normal. He had a normal IgG index and normal synthesis rate, and there were no bands. Other lab work included HIV, anticardiolipin antibodies, Sjogren's antibodies, rheumatoid factor, ACE level, ANA, and these were negative.   REVIEW OF SYSTEMS: Constitutional: No fevers, chills, sweats, or change in appetite Eyes: No visual changes, double vision, eye pain Ear, nose and throat: No hearing loss, ear pain, nasal congestion, sore throat Cardiovascular: No chest pain, palpitations  Respiratory:  No shortness of breath at rest or with exertion.   No wheezes GastrointestinaI: No nausea, vomiting, diarrhea, abdominal pain, fecal incontinence Genitourinary:  No dysuria, urinary retention or frequency.  No  nocturia. Musculoskeletal:  No neck pain, back pain Integumentary: No rash, pruritus, skin lesions Neurological: as above Psychiatric: No depression at this time.  No anxiety Endocrine: No palpitations, diaphoresis, change in appetite, change in weigh or increased thirst Hematologic/Lymphatic:  No anemia, purpura, petechiae. Allergic/Immunologic: No itchy/runny eyes, nasal congestion, recent allergic reactions, rashes  ALLERGIES: Allergies[1]  HOME MEDICATIONS: Current Medications[2]  PAST MEDICAL HISTORY: Past Medical History:  Diagnosis Date   Depression    Multiple sclerosis    Neuromuscular disorder (HCC)     PAST SURGICAL HISTORY: No past surgical history on file.  FAMILY HISTORY: Family History  Problem Relation Age of Onset   Hypertension Mother     SOCIAL HISTORY: Social History   Socioeconomic History   Marital status: Single    Spouse name: Not on file   Number of children: Not on file   Years of education: Not on file   Highest education level: Not on file  Occupational History   Not on file  Tobacco Use   Smoking status: Never   Smokeless tobacco: Not on file  Substance and Sexual Activity   Alcohol use: Yes    Comment: occasional   Drug use: No   Sexual activity: Yes    Birth control/protection: Pill  Other Topics Concern   Not on file  Social History Narrative   Not on file   Social Drivers of Health   Tobacco Use: Unknown (09/30/2024)   Patient History    Smoking Tobacco Use: Never    Smokeless Tobacco Use: Unknown    Passive Exposure: Not on file  Financial Resource Strain: Medium Risk (09/17/2023)   Received from [facility]   Overall Financial Resource Strain (CARDIA)    Difficulty of Paying Living Expenses: Somewhat hard  Food Insecurity: Food Insecurity Present (09/07/2024)   Epic    Worried About Programme Researcher, Broadcasting/film/video in the Last Year: Sometimes true    Ran Out of Food in the Last Year: Never true  Transportation Needs: No  Transportation Needs (09/07/2024)   Epic    Lack of Transportation (Medical): No    Lack of Transportation (Non-Medical): No  Physical Activity: Insufficiently Active (09/17/2023)   Received from [facility]   Exercise Vital Sign    On average, how many days per week do you engage in moderate to strenuous exercise (like a brisk walk)?: 1 day    On average, how many minutes do you engage in exercise at this level?: 10 min  Stress: Stress Concern Present (12/25/2022)   Received from [facility] of Occupational Health - Occupational Stress Questionnaire    Feeling of Stress : Very much  Social Connections: Moderately Integrated (09/07/2024)   Social Connection and Isolation Panel    Frequency of Communication with Friends and Family: More than three times a week    Frequency of Social Gatherings with Friends and Family: Twice a week    Attends Religious Services: Never    Database Administrator or Organizations: Yes    Attends Engineer, Structural: More than 4 times per year    Marital Status: Living with partner  Intimate Partner Violence: Not At Risk (09/07/2024)   Epic    Fear of Current or Ex-Partner: No  Emotionally Abused: No    Physically Abused: No    Sexually Abused: No  Depression (PHQ2-9): Not on file  Alcohol Screen: Not on file  Housing: Low Risk (09/07/2024)   Epic    Unable to Pay for Housing in the Last Year: No    Number of Times Moved in the Last Year: 0    Homeless in the Last Year: No  Utilities: Not At Risk (09/07/2024)   Epic    Threatened with loss of utilities: No  Health Literacy: Not on file       PHYSICAL EXAM  There were no vitals filed for this visit.  There is no height or weight on file to calculate BMI.   General: The patient is well-developed and well-nourished and in no acute distress  HEENT:  Head is [place]/AT.  Sclera are anicteric.  Funduscopic exam shows normal optic discs and retinal vessels.  Neck: No  carotid bruits are noted.  The neck is nontender.  Cardiovascular: The heart has a regular rate and rhythm with a normal S1 and S2. There were no murmurs, gallops or rubs.    Skin: Extremities are without rash or  edema.  Musculoskeletal:  Back is nontender  Neurologic Exam  Mental status: The patient is alert and oriented x 3 at the time of the examination. The patient has apparent normal recent and remote memory, with an apparently normal attention span and concentration ability.   Speech is normal.  Cranial nerves: Extraocular movements are full. Pupils are equal, round, and reactive to light and accomodation.  Visual fields are full.  Facial symmetry is present. There is good facial sensation to soft touch bilaterally.Facial strength is normal.  Trapezius and sternocleidomastoid strength is normal. No dysarthria is noted.  The tongue is midline, and the patient has symmetric elevation of the soft palate. No obvious hearing deficits are noted.  Motor:  Muscle bulk is normal.   Tone is normal. Strength is  5 / 5 in all 4 extremities.   Sensory: Sensory testing is intact to pinprick, soft touch and vibration sensation in all 4 extremities.  Coordination: Cerebellar testing reveals good finger-nose-finger and heel-to-shin bilaterally.  Gait and station: Station is normal.   Gait is normal. Tandem gait is normal. Romberg is negative.   Reflexes: Deep tendon reflexes are symmetric and normal bilaterally.   Plantar responses are flexor.    DIAGNOSTIC DATA (LABS, IMAGING, TESTING) - I reviewed patient records, labs, notes, testing and imaging myself where available.  Lab Results  Component Value Date   WBC 13.1 (H) 09/08/2024   HGB 14.4 09/08/2024   HCT 41.1 09/08/2024   MCV 87.8 09/08/2024   PLT 325 09/08/2024      Component Value Date/Time   NA 136 09/08/2024 0528   K 4.4 09/08/2024 0528   CL 102 09/08/2024 0528   CO2 21 (L) 09/08/2024 0528   GLUCOSE 142 (H) 09/08/2024 0528    BUN 25 (H) 09/08/2024 0528   CREATININE 1.31 (H) 09/08/2024 0528   CALCIUM 9.2 09/08/2024 0528   GFRNONAA >60 09/08/2024 0528   GFRAA >60 06/30/2017 2218   No results found for: CHOL, HDL, LDLCALC, LDLDIRECT, TRIG, CHOLHDL No results found for: YHAJ8R Lab Results  Component Value Date   VITAMINB12 1,165 (H) 09/08/2024   No results found for: TSH     ASSESSMENT AND PLAN  ***   Taleah Bellantoni A. Vear, MD, Teola RENO 10/09/2024, 9:03 PM Certified in Neurology, Clinical Neurophysiology, Sleep Medicine and Neuroimaging  [facility] [address], [address] [place], [place] [address] [phone]     [1]  Allergies Allergen Reactions   Hydromorphone Other (See Comments) and Tinitus    Ear ringing  [2]  Current Outpatient Medications:    baclofen  (LIORESAL ) 20 MG tablet, Take 20 mg by mouth daily as needed for muscle spasms., Disp: , Rfl:    Cholecalciferol  (VITAMIN D3 SUPER STRENGTH) 50 MCG (2000 UT) TABS, Take 1 tablet by mouth daily. (Patient not taking: Reported on 09/30/2024), Disp: , Rfl:    [Paused] cyanocobalamin 2000 MCG tablet, Take 2,000 mcg by mouth daily., Disp: , Rfl:    gabapentin  (NEURONTIN ) 100 MG capsule, Take 200 mg by mouth daily as needed (pain)., Disp: , Rfl:    naproxen sodium (ALEVE) 220 MG tablet, Take 220-440 mg by mouth daily as needed (pain)., Disp: , Rfl:    ondansetron  (ZOFRAN -ODT) 4 MG disintegrating tablet, Take 1 tablet (4 mg total) by mouth every 8 (eight) hours as needed for nausea or vomiting. (Patient not taking: Reported on 09/30/2024), Disp: 20 tablet, Rfl: 0   pantoprazole  (PROTONIX ) 40 MG tablet, Take 1 tablet (40 mg total) by mouth daily., Disp: 7 tablet, Rfl: 0   predniSONE  (DELTASONE ) 50 MG tablet, Take 25 tablets (1,250 mg total) by mouth daily. Take with breakfast!, Disp: 75 tablet, Rfl: 0  "

## 2024-10-10 ENCOUNTER — Encounter: Payer: Self-pay | Admitting: Neurology

## 2024-10-10 ENCOUNTER — Ambulatory Visit: Admitting: Neurology

## 2024-10-10 VITALS — BP 146/75 | HR 75 | Ht 66.0 in | Wt 151.2 lb

## 2024-10-10 DIAGNOSIS — F3289 Other specified depressive episodes: Secondary | ICD-10-CM

## 2024-10-10 DIAGNOSIS — Z79899 Other long term (current) drug therapy: Secondary | ICD-10-CM

## 2024-10-10 DIAGNOSIS — H532 Diplopia: Secondary | ICD-10-CM

## 2024-10-10 DIAGNOSIS — R5383 Other fatigue: Secondary | ICD-10-CM

## 2024-10-10 DIAGNOSIS — G35A Relapsing-remitting multiple sclerosis: Secondary | ICD-10-CM

## 2024-10-10 DIAGNOSIS — Z8669 Personal history of other diseases of the nervous system and sense organs: Secondary | ICD-10-CM

## 2024-10-10 MED ORDER — TERIFLUNOMIDE 14 MG PO TABS: ORAL_TABLET | ORAL | 3 refills | Status: AC

## 2024-10-15 ENCOUNTER — Ambulatory Visit: Admitting: Occupational Therapy

## 2024-10-15 ENCOUNTER — Ambulatory Visit: Admitting: Physical Therapy

## 2024-10-22 ENCOUNTER — Ambulatory Visit: Admitting: Occupational Therapy

## 2024-10-22 ENCOUNTER — Ambulatory Visit: Admitting: Physical Therapy

## 2024-10-29 ENCOUNTER — Ambulatory Visit: Admitting: Occupational Therapy

## 2024-10-29 ENCOUNTER — Ambulatory Visit: Admitting: Physical Therapy

## 2024-11-05 ENCOUNTER — Ambulatory Visit: Admitting: Occupational Therapy

## 2024-11-05 ENCOUNTER — Ambulatory Visit: Admitting: Physical Therapy

## 2024-11-12 ENCOUNTER — Ambulatory Visit: Admitting: Physical Therapy

## 2024-11-12 ENCOUNTER — Ambulatory Visit: Admitting: Occupational Therapy

## 2024-11-19 ENCOUNTER — Ambulatory Visit: Admitting: Occupational Therapy

## 2024-11-19 ENCOUNTER — Ambulatory Visit: Admitting: Physical Therapy
